# Patient Record
Sex: Female | Born: 1980
Health system: Northeastern US, Academic
[De-identification: ages and names within clinical notes are randomized; demographics above are authoritative.]

## PROBLEM LIST (undated history)

## (undated) DIAGNOSIS — Z789 Other specified health status: Secondary | ICD-10-CM

## (undated) HISTORY — DX: Other specified health status: Z78.9

## (undated) HISTORY — PX: TUBAL LIGATION: SHX77

## (undated) HISTORY — PX: NO PAST SURGERIES: SHX2092

---

## 2016-11-29 ENCOUNTER — Telehealth: Payer: Self-pay

## 2016-11-29 NOTE — Telephone Encounter (Signed)
7.27- pt referred to reproductive genetics from dr.surosky's office for age and prev.child with a chromosome problem. Called pt, when she heard where I was calling from, the phone went dead. Called the pt back and she answered. Doesn't understand why her OB would refer her. Explained it was mainly because of her daughter and her chromosome problem and we would like to get her daughters records. Pt stated she would not do anything and she is perfectly comfortable with anything. Does not want to do testing or any of that. Please cancel her paperwork. Asked pt to call me back if she changes her mind.  [facility] know.

## 2018-03-23 ENCOUNTER — Ambulatory Visit: Payer: Medicaid Other | Admitting: Psychology

## 2018-07-21 DIAGNOSIS — Z3009 Encounter for other general counseling and advice on contraception: Secondary | ICD-10-CM | POA: Diagnosis not present

## 2018-07-21 DIAGNOSIS — N390 Urinary tract infection, site not specified: Secondary | ICD-10-CM | POA: Diagnosis not present

## 2018-07-21 DIAGNOSIS — N760 Acute vaginitis: Secondary | ICD-10-CM | POA: Diagnosis not present

## 2018-07-21 DIAGNOSIS — Z1388 Encounter for screening for disorder due to exposure to contaminants: Secondary | ICD-10-CM | POA: Diagnosis not present

## 2018-07-21 DIAGNOSIS — Z0389 Encounter for observation for other suspected diseases and conditions ruled out: Secondary | ICD-10-CM | POA: Diagnosis not present

## 2018-07-21 DIAGNOSIS — Z113 Encounter for screening for infections with a predominantly sexual mode of transmission: Secondary | ICD-10-CM | POA: Diagnosis not present

## 2018-11-25 ENCOUNTER — Other Ambulatory Visit: Payer: Self-pay

## 2018-11-25 ENCOUNTER — Ambulatory Visit (INDEPENDENT_AMBULATORY_CARE_PROVIDER_SITE_OTHER): Payer: Medicaid Other

## 2018-11-25 DIAGNOSIS — O09529 Supervision of elderly multigravida, unspecified trimester: Secondary | ICD-10-CM

## 2018-11-25 DIAGNOSIS — Z3201 Encounter for pregnancy test, result positive: Secondary | ICD-10-CM | POA: Diagnosis not present

## 2018-11-25 DIAGNOSIS — O09522 Supervision of elderly multigravida, second trimester: Secondary | ICD-10-CM

## 2018-11-25 DIAGNOSIS — O0990 Supervision of high risk pregnancy, unspecified, unspecified trimester: Secondary | ICD-10-CM | POA: Insufficient documentation

## 2018-11-25 LAB — POCT URINE PREGNANCY: Preg Test, Ur: POSITIVE — AB

## 2018-11-25 MED ORDER — BLOOD PRESSURE KIT: PACK | 0 refills | Status: AC

## 2018-11-25 MED ORDER — DOXYLAMINE-PYRIDOXINE 10-10 MG PO TBEC: 2.0000 | DELAYED_RELEASE_TABLET | Freq: Every day | ORAL | 2 refills | Status: DC

## 2018-11-25 NOTE — Progress Notes (Signed)
Felicia Griffin presents today for UPT. She complains of nausea since +UPT. LMP: 08/26/18 aprx.    OBJECTIVE: Appears well, in no apparent distress.  OB History    Gravida  9   Para  8   Term  8   Preterm      AB      Living  9     SAB      TAB      Ectopic      Multiple  1   Live Births  9          Home UPT Result: Positive  In-Office UPT result: Positive  I have reviewed the patient's medical, obstetrical, social, and family histories, and medications.   ASSESSMENT: Positive pregnancy test  PLAN NOB intake completed Prenatal care to be completed at:  [place] sent to the pharmacy BP cuff ordered Pt to download Babyscripts and Mychart apps

## 2018-11-25 NOTE — Progress Notes (Signed)
Patient seen and assessed by nursing staff during this encounter. I have reviewed the chart and agree with the documentation and plan.  Levonia Wolfley, MD 11/25/2018 3:00 PM    

## 2018-12-09 ENCOUNTER — Other Ambulatory Visit (HOSPITAL_COMMUNITY)
Admission: RE | Admit: 2018-12-09 | Discharge: 2018-12-09 | Disposition: A | Discharge status: Home / Self Care | Payer: Medicaid Other | Source: Ambulatory Visit | Attending: Obstetrics & Gynecology | Admitting: Obstetrics & Gynecology

## 2018-12-09 ENCOUNTER — Other Ambulatory Visit: Payer: Self-pay

## 2018-12-09 ENCOUNTER — Ambulatory Visit (INDEPENDENT_AMBULATORY_CARE_PROVIDER_SITE_OTHER): Payer: Medicaid Other | Admitting: Obstetrics & Gynecology

## 2018-12-09 ENCOUNTER — Encounter: Payer: Self-pay | Admitting: Obstetrics & Gynecology

## 2018-12-09 DIAGNOSIS — O0942 Supervision of pregnancy with grand multiparity, second trimester: Secondary | ICD-10-CM

## 2018-12-09 DIAGNOSIS — O09522 Supervision of elderly multigravida, second trimester: Secondary | ICD-10-CM | POA: Diagnosis not present

## 2018-12-09 DIAGNOSIS — O09529 Supervision of elderly multigravida, unspecified trimester: Secondary | ICD-10-CM | POA: Diagnosis not present

## 2018-12-09 DIAGNOSIS — O0990 Supervision of high risk pregnancy, unspecified, unspecified trimester: Secondary | ICD-10-CM

## 2018-12-09 DIAGNOSIS — Z3A15 15 weeks gestation of pregnancy: Secondary | ICD-10-CM | POA: Diagnosis not present

## 2018-12-09 MED ORDER — BLOOD PRESSURE MONITOR KIT: 1.0000 | PACK | 0 refills | Status: DC

## 2018-12-09 NOTE — Patient Instructions (Signed)

## 2018-12-09 NOTE — Progress Notes (Signed)
  Subjective:  She moved from [facility] 11/2017  Felicia Griffin is a [id number] [redacted]w[redacted]d being seen today for her first obstetrical visit.  Her obstetrical history is significant for advanced maternal age and grand multiparity and h/o di/di twins. Patient does intend to breast feed. Pregnancy history fully reviewed.  Patient reports fatigue.  Vitals:   12/09/18 1346  BP: (!) 98/58  Pulse: 87  Weight: 174 lb (78.9 kg)    HISTORY: OB History  Gravida Para Term Preterm AB Living  8 7 7     8   SAB TAB Ectopic Multiple Live Births        1 8    # Outcome Date GA Lbr Len/2nd Weight Sex Delivery Anes PTL Lv  8 Current           7 Term 05/14/17 [redacted]w[redacted]d  6 lb (2.722 kg) F Vag-Spont   LIV  6 Term 04/03/15 [redacted]w[redacted]d  5 lb 8 oz (2.495 kg) M Vag-Spont   LIV  5 Term 01/28/07 [redacted]w[redacted]d  6 lb 1 oz (2.75 kg) F Vag-Spont   LIV  4 Term 04/26/04 [redacted]w[redacted]d  6 lb 2 oz (2.778 kg) F Vag-Spont   LIV  3 Term 08/26/00 [redacted]w[redacted]d  7 lb (3.175 kg) M Vag-Spont   LIV  2A Term 12/25/98 [redacted]w[redacted]d  5 lb (2.268 kg) M Vag-Spont   LIV  2B Term 12/25/98 [redacted]w[redacted]d  4 lb 15 oz (2.24 kg) F Vag-Spont   LIV  1 Term 08/26/97 [redacted]w[redacted]d  7 lb (3.175 kg) M Vag-Spont   LIV   History reviewed. No pertinent past medical history. History reviewed. No pertinent surgical history. Family History  Problem Relation Age of Onset  . Breast cancer Sister      Exam    Uterus:     Pelvic Exam:    Perineum: No Hemorrhoids   Vulva: normal   Vagina:  normal mucosa   pH:     Cervix: no lesions   Adnexa: normal adnexa   Bony Pelvis: average  System: Breast:  normal appearance, no masses or tenderness   Skin: normal coloration and turgor, no rashes    Neurologic: oriented, normal mood   Extremities: deformities: varicose veins both legs   HEENT PERRLA and sclera clear, anicteric   Mouth/Teeth mucous membranes moist, pharynx normal without lesions and dental hygiene good   Neck supple and no masses   Cardiovascular: regular rate and rhythm, no murmurs or  gallops   Respiratory:  appears well, vitals normal, no respiratory distress, acyanotic, normal RR, neck free of mass or lymphadenopathy, chest clear, no wheezing, crepitations, rhonchi, normal symmetric air entry   Abdomen: soft, non-tender; bowel sounds normal; no masses,  no organomegaly   Urinary: urethral meatus normal      Assessment:    Pregnancy: [id number] Patient Active Problem List   Diagnosis Date Noted  . AMA (advanced maternal age) multigravida 35+ 12/09/2018  . Supervision of high risk pregnancy, antepartum 11/25/2018        Plan:     Initial labs drawn. Prenatal vitamins. Problem list reviewed and updated. Genetic Screening discussed . Panorama ordered  Ultrasound discussed; fetal survey: ordered.  Follow up in 4 weeks. 50% of 30 min visit spent on counseling and coordination of care.     Emeterio Reeve 12/09/2018

## 2018-12-09 NOTE — Progress Notes (Signed)
NOB  Genetic Screening:Desires  Last pap:09/2018 [facility] pt [facility]    CC:  UTI sx's per pt.

## 2018-12-10 LAB — OBSTETRIC PANEL, INCLUDING HIV
Antibody Screen: NEGATIVE
Basophils Absolute: 0 10*3/uL (ref 0.0–0.2)
Basos: 0 %
EOS (ABSOLUTE): 0 10*3/uL (ref 0.0–0.4)
Eos: 0 %
HIV Screen 4th Generation wRfx: NONREACTIVE
Hematocrit: 28.4 % — ABNORMAL LOW (ref 34.0–46.6)
Hemoglobin: 9.6 g/dL — ABNORMAL LOW (ref 11.1–15.9)
Hepatitis B Surface Ag: NEGATIVE
Immature Grans (Abs): 0.1 10*3/uL (ref 0.0–0.1)
Immature Granulocytes: 1 %
Lymphocytes Absolute: 1.6 10*3/uL (ref 0.7–3.1)
Lymphs: 12 %
MCH: 31.5 pg (ref 26.6–33.0)
MCHC: 33.8 g/dL (ref 31.5–35.7)
MCV: 93 fL (ref 79–97)
Monocytes Absolute: 0.7 10*3/uL (ref 0.1–0.9)
Monocytes: 5 %
Neutrophils Absolute: 11.1 10*3/uL — ABNORMAL HIGH (ref 1.4–7.0)
Neutrophils: 82 %
Platelets: 247 10*3/uL (ref 150–450)
RBC: 3.05 x10E6/uL — ABNORMAL LOW (ref 3.77–5.28)
RDW: 14.1 % (ref 11.7–15.4)
RPR Ser Ql: NONREACTIVE
Rh Factor: POSITIVE
Rubella Antibodies, IGG: 18.7 index (ref >0.99)
WBC: 13.5 10*3/uL — ABNORMAL HIGH (ref 3.4–10.8)

## 2018-12-11 LAB — URINE CULTURE, OB REFLEX

## 2018-12-11 LAB — CULTURE, OB URINE

## 2018-12-14 ENCOUNTER — Encounter: Payer: Self-pay | Admitting: Obstetrics & Gynecology

## 2018-12-14 LAB — CERVICOVAGINAL ANCILLARY ONLY
Candida vaginitis: NEGATIVE
Chlamydia: NEGATIVE
Neisseria Gonorrhea: NEGATIVE
Trichomonas: NEGATIVE

## 2018-12-15 LAB — CYTOLOGY - PAP
Diagnosis: NEGATIVE
HPV 16/18/45 genotyping: NEGATIVE
HPV: DETECTED — AB

## 2018-12-21 ENCOUNTER — Ambulatory Visit: Payer: Medicaid Other | Admitting: Registered Nurse

## 2018-12-23 ENCOUNTER — Encounter: Payer: Self-pay | Admitting: Obstetrics and Gynecology

## 2018-12-23 DIAGNOSIS — R8789 Other abnormal findings in specimens from female genital organs: Secondary | ICD-10-CM | POA: Insufficient documentation

## 2018-12-23 DIAGNOSIS — Z641 Problems related to multiparity: Secondary | ICD-10-CM | POA: Insufficient documentation

## 2018-12-23 DIAGNOSIS — R87618 Other abnormal cytological findings on specimens from cervix uteri: Secondary | ICD-10-CM | POA: Insufficient documentation

## 2019-01-04 ENCOUNTER — Ambulatory Visit: Payer: Medicaid Other | Admitting: Registered Nurse

## 2019-01-06 ENCOUNTER — Encounter (HOSPITAL_COMMUNITY): Payer: Self-pay

## 2019-01-06 ENCOUNTER — Ambulatory Visit (HOSPITAL_COMMUNITY)
Admission: RE | Admit: 2019-01-06 | Discharge: 2019-01-06 | Disposition: A | Discharge status: Home / Self Care | Payer: Medicaid Other | Source: Ambulatory Visit | Attending: Obstetrics and Gynecology | Admitting: Obstetrics and Gynecology

## 2019-01-06 ENCOUNTER — Ambulatory Visit (HOSPITAL_COMMUNITY): Payer: Medicaid Other | Admitting: *Deleted

## 2019-01-06 ENCOUNTER — Other Ambulatory Visit: Payer: Self-pay

## 2019-01-06 ENCOUNTER — Encounter: Payer: Medicaid Other | Admitting: Obstetrics and Gynecology

## 2019-01-06 VITALS — BP 99/69 | HR 84 | Temp 98.3°F | Ht 69.0 in

## 2019-01-06 DIAGNOSIS — O0990 Supervision of high risk pregnancy, unspecified, unspecified trimester: Secondary | ICD-10-CM

## 2019-01-06 DIAGNOSIS — O09529 Supervision of elderly multigravida, unspecified trimester: Secondary | ICD-10-CM | POA: Diagnosis not present

## 2019-01-06 DIAGNOSIS — Z3A19 19 weeks gestation of pregnancy: Secondary | ICD-10-CM | POA: Diagnosis not present

## 2019-01-06 DIAGNOSIS — O09522 Supervision of elderly multigravida, second trimester: Secondary | ICD-10-CM

## 2019-01-06 DIAGNOSIS — O0940 Supervision of pregnancy with grand multiparity, unspecified trimester: Secondary | ICD-10-CM | POA: Diagnosis not present

## 2019-01-07 ENCOUNTER — Other Ambulatory Visit (HOSPITAL_COMMUNITY): Payer: Self-pay | Admitting: *Deleted

## 2019-01-07 DIAGNOSIS — O09523 Supervision of elderly multigravida, third trimester: Secondary | ICD-10-CM

## 2019-01-15 DIAGNOSIS — O0990 Supervision of high risk pregnancy, unspecified, unspecified trimester: Secondary | ICD-10-CM | POA: Diagnosis not present

## 2019-01-21 ENCOUNTER — Encounter: Payer: Medicaid Other | Admitting: Obstetrics and Gynecology

## 2019-02-02 ENCOUNTER — Encounter: Payer: Medicaid Other | Admitting: Obstetrics and Gynecology

## 2019-02-15 ENCOUNTER — Encounter: Payer: Medicaid Other | Admitting: Obstetrics and Gynecology

## 2019-03-01 ENCOUNTER — Encounter: Payer: Medicaid Other | Admitting: Obstetrics & Gynecology

## 2019-03-03 ENCOUNTER — Other Ambulatory Visit: Payer: Self-pay

## 2019-03-03 ENCOUNTER — Ambulatory Visit (INDEPENDENT_AMBULATORY_CARE_PROVIDER_SITE_OTHER): Payer: Medicaid Other | Admitting: Obstetrics & Gynecology

## 2019-03-03 DIAGNOSIS — O0992 Supervision of high risk pregnancy, unspecified, second trimester: Secondary | ICD-10-CM

## 2019-03-03 DIAGNOSIS — O0990 Supervision of high risk pregnancy, unspecified, unspecified trimester: Secondary | ICD-10-CM

## 2019-03-03 DIAGNOSIS — Z3A27 27 weeks gestation of pregnancy: Secondary | ICD-10-CM

## 2019-03-03 MED ORDER — PANTOPRAZOLE SODIUM 20 MG PO TBEC: 20.0000 mg | DELAYED_RELEASE_TABLET | Freq: Every day | ORAL | 3 refills | Status: DC

## 2019-03-03 NOTE — Patient Instructions (Signed)

## 2019-03-03 NOTE — Progress Notes (Signed)
ROB Declined TDAP 

## 2019-03-03 NOTE — Progress Notes (Signed)
   PRENATAL VISIT NOTE  Subjective:  Felicia Griffin is a 38 y.o. [id number] at [redacted]w[redacted]d being seen today for ongoing prenatal care.  She is currently monitored for the following issues for this high-risk pregnancy and has Supervision of high risk pregnancy, antepartum; AMA (advanced maternal age) multigravida 35+; [place] multiparity; and Abnormal Papanicolaou smear of cervix with positive human papilloma virus (HPV) test on their problem list.  Patient reports heartburn, nausea and varicose veins.  Contractions: Not present. Vag. Bleeding: None.  Movement: Present. Denies leaking of fluid.   The following portions of the patient's history were reviewed and updated as appropriate: allergies, current medications, past family history, past medical history, past social history, past surgical history and problem list.   Objective:   Vitals:   03/03/19 1011  BP: 101/63  Pulse: 83  Weight: 175 lb 14.4 oz (79.8 kg)    Fetal Status: Fetal Heart Rate (bpm): 143   Movement: Present     General:  Alert, oriented and cooperative. Patient is in no acute distress.  Skin: Skin is warm and dry. No rash noted.   Cardiovascular: Normal heart rate noted  Respiratory: Normal respiratory effort, no problems with respiration noted  Abdomen: Soft, gravid, appropriate for gestational age.  Pain/Pressure: Present     Pelvic: Cervical exam deferred        Extremities: Normal range of motion.  Edema: Trace  Mental Status: Normal mood and affect. Normal behavior. Normal judgment and thought content.   Assessment and Plan:  Pregnancy: [id number] at [redacted]w[redacted]d 1. Supervision of high risk pregnancy, antepartum Needs GTT next. Add Protonix for reflux  Preterm labor symptoms and general obstetric precautions including but not limited to vaginal bleeding, contractions, leaking of fluid and fetal movement were reviewed in detail with the patient. Please refer to After Visit Summary for other counseling recommendations.   Return  in about 2 weeks (around 03/17/2019) for 2 hr gtt.  Future Appointments  Date Time Provider [place]  03/17/2019 11:15 AM [place] [facility]  03/17/2019 11:15 AM [facility] [place] 4 [facility]    Emeterio Reeve, MD

## 2019-03-10 ENCOUNTER — Encounter: Payer: Medicaid Other | Admitting: Obstetrics and Gynecology

## 2019-03-17 ENCOUNTER — Ambulatory Visit (HOSPITAL_COMMUNITY)
Admission: RE | Admit: 2019-03-17 | Discharge: 2019-03-17 | Disposition: A | Discharge status: Home / Self Care | Payer: Medicaid Other | Source: Ambulatory Visit | Attending: Maternal & Fetal Medicine | Admitting: Maternal & Fetal Medicine

## 2019-03-17 ENCOUNTER — Ambulatory Visit (HOSPITAL_COMMUNITY): Payer: Medicaid Other

## 2019-03-18 ENCOUNTER — Encounter: Payer: Medicaid Other | Admitting: Obstetrics and Gynecology

## 2019-03-18 ENCOUNTER — Other Ambulatory Visit: Payer: Medicaid Other

## 2019-03-19 ENCOUNTER — Ambulatory Visit (HOSPITAL_COMMUNITY): Payer: Medicaid Other

## 2019-03-22 ENCOUNTER — Ambulatory Visit (HOSPITAL_COMMUNITY): Payer: Medicaid Other

## 2019-03-29 ENCOUNTER — Ambulatory Visit (HOSPITAL_COMMUNITY): Payer: Medicaid Other

## 2019-03-31 ENCOUNTER — Ambulatory Visit (HOSPITAL_COMMUNITY): Payer: Medicaid Other | Admitting: *Deleted

## 2019-03-31 ENCOUNTER — Other Ambulatory Visit: Payer: Self-pay

## 2019-03-31 ENCOUNTER — Other Ambulatory Visit (HOSPITAL_COMMUNITY): Payer: Self-pay | Admitting: *Deleted

## 2019-03-31 ENCOUNTER — Ambulatory Visit (HOSPITAL_COMMUNITY)
Admission: RE | Admit: 2019-03-31 | Discharge: 2019-03-31 | Disposition: A | Discharge status: Home / Self Care | Payer: Medicaid Other | Source: Ambulatory Visit | Attending: Maternal & Fetal Medicine | Admitting: Maternal & Fetal Medicine

## 2019-03-31 ENCOUNTER — Encounter (HOSPITAL_COMMUNITY): Payer: Self-pay

## 2019-03-31 VITALS — BP 112/61 | HR 84 | Temp 97.6°F

## 2019-03-31 DIAGNOSIS — Z3A31 31 weeks gestation of pregnancy: Secondary | ICD-10-CM

## 2019-03-31 DIAGNOSIS — O0943 Supervision of pregnancy with grand multiparity, third trimester: Secondary | ICD-10-CM

## 2019-03-31 DIAGNOSIS — O09529 Supervision of elderly multigravida, unspecified trimester: Secondary | ICD-10-CM

## 2019-03-31 DIAGNOSIS — O09523 Supervision of elderly multigravida, third trimester: Secondary | ICD-10-CM | POA: Insufficient documentation

## 2019-03-31 DIAGNOSIS — Z362 Encounter for other antenatal screening follow-up: Secondary | ICD-10-CM

## 2019-04-08 ENCOUNTER — Other Ambulatory Visit: Payer: Self-pay

## 2019-04-08 ENCOUNTER — Encounter: Payer: Self-pay | Admitting: Obstetrics and Gynecology

## 2019-04-08 ENCOUNTER — Ambulatory Visit (INDEPENDENT_AMBULATORY_CARE_PROVIDER_SITE_OTHER): Payer: Medicaid Other | Admitting: Obstetrics and Gynecology

## 2019-04-08 VITALS — BP 117/78 | HR 103 | Wt 172.4 lb

## 2019-04-08 DIAGNOSIS — O0990 Supervision of high risk pregnancy, unspecified, unspecified trimester: Secondary | ICD-10-CM

## 2019-04-08 DIAGNOSIS — O0993 Supervision of high risk pregnancy, unspecified, third trimester: Secondary | ICD-10-CM

## 2019-04-08 DIAGNOSIS — O09523 Supervision of elderly multigravida, third trimester: Secondary | ICD-10-CM

## 2019-04-08 DIAGNOSIS — Z3A32 32 weeks gestation of pregnancy: Secondary | ICD-10-CM

## 2019-04-08 DIAGNOSIS — Z641 Problems related to multiparity: Secondary | ICD-10-CM
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
## 2019-04-14 ENCOUNTER — Other Ambulatory Visit: Payer: Self-pay

## 2019-04-14 ENCOUNTER — Other Ambulatory Visit: Payer: Medicaid Other

## 2019-04-14 ENCOUNTER — Ambulatory Visit (INDEPENDENT_AMBULATORY_CARE_PROVIDER_SITE_OTHER): Payer: Medicaid Other | Admitting: Obstetrics and Gynecology

## 2019-04-14 VITALS — BP 110/67 | HR 90 | Wt 172.8 lb

## 2019-04-14 DIAGNOSIS — O0990 Supervision of high risk pregnancy, unspecified, unspecified trimester: Secondary | ICD-10-CM

## 2019-04-14 DIAGNOSIS — Z3A33 33 weeks gestation of pregnancy: Secondary | ICD-10-CM | POA: Diagnosis not present

## 2019-04-14 DIAGNOSIS — O0993 Supervision of high risk pregnancy, unspecified, third trimester: Secondary | ICD-10-CM

## 2019-04-14 DIAGNOSIS — Z3009 Encounter for other general counseling and advice on contraception: Secondary | ICD-10-CM | POA: Insufficient documentation

## 2019-04-14 DIAGNOSIS — O09523 Supervision of elderly multigravida, third trimester: Secondary | ICD-10-CM

## 2019-04-14 DIAGNOSIS — Z641 Problems related to multiparity: Secondary | ICD-10-CM

## 2019-04-14 LAB — POCT URINALYSIS DIPSTICK
Bilirubin, UA: NEGATIVE
Blood, UA: NEGATIVE
Glucose, UA: NEGATIVE
Nitrite, UA: NEGATIVE
Protein, UA: NEGATIVE
Spec Grav, UA: 1.015 (ref 1.010–1.025)
Urobilinogen, UA: 0.2 E.U./dL
pH, UA: 8 (ref 5.0–8.0)

## 2019-04-14 NOTE — Progress Notes (Addendum)
HOB/GTT.  Declined FLU and TDAP.  Reports no problems today. BTS signed.

## 2019-04-14 NOTE — Progress Notes (Signed)
Subjective:  Felicia Griffin is a 38 y.o. [id number] at [redacted]w[redacted]d being seen today for ongoing prenatal care.  She is currently monitored for the following issues for this high-risk pregnancy and has Supervision of high risk pregnancy, antepartum; AMA (advanced maternal age) multigravida 52+; [place] multiparity; Abnormal Papanicolaou smear of cervix with positive human papilloma virus (HPV) test; and Unwanted fertility on their problem list.  Patient reports general discomforts of pregnancy.  Contractions: Not present. Vag. Bleeding: None.  Movement: Present. Denies leaking of fluid.   The following portions of the patient's history were reviewed and updated as appropriate: allergies, current medications, past family history, past medical history, past social history, past surgical history and problem list. Problem list updated.  Objective:   Vitals:   04/14/19 0847  BP: 110/67  Pulse: 90  Weight: 172 lb 12.8 oz (78.4 kg)    Fetal Status:     Movement: Present     General:  Alert, oriented and cooperative. Patient is in no acute distress.  Skin: Skin is warm and dry. No rash noted.   Cardiovascular: Normal heart rate noted  Respiratory: Normal respiratory effort, no problems with respiration noted  Abdomen: Soft, gravid, appropriate for gestational age. Pain/Pressure: Present     Pelvic:  Cervical exam deferred        Extremities: Normal range of motion.  Edema: None  Mental Status: Normal mood and affect. Normal behavior. Normal judgment and thought content.   Urinalysis:      Assessment and Plan:  Pregnancy: [id number] at [redacted]w[redacted]d  1. Supervision of high risk pregnancy, antepartum Stable Growth scan 05/05/19 - Glucose Tolerance, 2 Hours w/1 Hour - CBC - HIV antibody (with reflex) - RPR - POCT Urinalysis Dipstick  2. [place] multiparity Stable BTL papers today  3. Multigravida of advanced maternal age in third trimester LR NIPS Growth scan 05/05/19  4. Unwanted fertility BTL papers  today  Preterm labor symptoms and general obstetric precautions including but not limited to vaginal bleeding, contractions, leaking of fluid and fetal movement were reviewed in detail with the patient. Please refer to After Visit Summary for other counseling recommendations.  Return in about 2 weeks (around 04/28/2019) for OB visit, virtual.   Chancy Milroy, MD

## 2019-04-14 NOTE — Patient Instructions (Signed)
Third Trimester of Pregnancy The third trimester is from week 28 through week 40 (months 7 through 9). The third trimester is a time when the unborn baby (fetus) is growing rapidly. At the end of the ninth month, the fetus is about 20 inches in length and weighs 6-10 pounds. Body changes during your third trimester Your body will continue to go through many changes during pregnancy. The changes vary from woman to woman. During the third trimester:  Your weight will continue to increase. You can expect to gain 25-35 pounds (11-16 kg) by the end of the pregnancy.  You may begin to get stretch marks on your hips, abdomen, and breasts.  You may urinate more often because the fetus is moving lower into your pelvis and pressing on your bladder.  You may develop or continue to have heartburn. This is caused by increased hormones that slow down muscles in the digestive tract.  You may develop or continue to have constipation because increased hormones slow digestion and cause the muscles that push waste through your intestines to relax.  You may develop hemorrhoids. These are swollen veins (varicose veins) in the rectum that can itch or be painful.  You may develop swollen, bulging veins (varicose veins) in your legs.  You may have increased body aches in the pelvis, back, or thighs. This is due to weight gain and increased hormones that are relaxing your joints.  You may have changes in your hair. These can include thickening of your hair, rapid growth, and changes in texture. Some women also have hair loss during or after pregnancy, or hair that feels dry or thin. Your hair will most likely return to normal after your baby is born.  Your breasts will continue to grow and they will continue to become tender. A yellow fluid (colostrum) may leak from your breasts. This is the first milk you are producing for your baby.  Your belly button may stick out.  You may notice more swelling in your hands,  face, or ankles.  You may have increased tingling or numbness in your hands, arms, and legs. The skin on your belly may also feel numb.  You may feel short of breath because of your expanding uterus.  You may have more problems sleeping. This can be caused by the size of your belly, increased need to urinate, and an increase in your body's metabolism.  You may notice the fetus "dropping," or moving lower in your abdomen (lightening).  You may have increased vaginal discharge.  You may notice your joints feel loose and you may have pain around your pelvic bone. What to expect at prenatal visits You will have prenatal exams every 2 weeks until week 36. Then you will have weekly prenatal exams. During a routine prenatal visit:  You will be weighed to make sure you and the baby are growing normally.  Your blood pressure will be taken.  Your abdomen will be measured to track your baby's growth.  The fetal heartbeat will be listened to.  Any test results from the previous visit will be discussed.  You may have a cervical check near your due date to see if your cervix has softened or thinned (effaced).  You will be tested for Group B streptococcus. This happens between 35 and 37 weeks. Your health care provider may ask you:  What your birth plan is.  How you are feeling.  If you are feeling the baby move.  If you have had any abnormal   symptoms, such as leaking fluid, bleeding, severe headaches, or abdominal cramping.  If you are using any tobacco products, including cigarettes, chewing tobacco, and electronic cigarettes.  If you have any questions. Other tests or screenings that may be performed during your third trimester include:  Blood tests that check for low iron levels (anemia).  Fetal testing to check the health, activity level, and growth of the fetus. Testing is done if you have certain medical conditions or if there are problems during the pregnancy.  Nonstress test  (NST). This test checks the health of your baby to make sure there are no signs of problems, such as the baby not getting enough oxygen. During this test, a belt is placed around your belly. The baby is made to move, and its heart rate is monitored during movement. What is false labor? False labor is a condition in which you feel small, irregular tightenings of the muscles in the womb (contractions) that usually go away with rest, changing position, or drinking water. These are called Braxton Hicks contractions. Contractions may last for hours, days, or even weeks before true labor sets in. If contractions come at regular intervals, become more frequent, increase in intensity, or become painful, you should see your health care provider. What are the signs of labor?  Abdominal cramps.  Regular contractions that start at 10 minutes apart and become stronger and more frequent with time.  Contractions that start on the top of the uterus and spread down to the lower abdomen and back.  Increased pelvic pressure and dull back pain.  A watery or bloody mucus discharge that comes from the vagina.  Leaking of amniotic fluid. This is also known as your "water breaking." It could be a slow trickle or a gush. Let your health care provider know if it has a color or strange odor. If you have any of these signs, call your health care provider right away, even if it is before your due date. Follow these instructions at home: Medicines  Follow your health care provider's instructions regarding medicine use. Specific medicines may be either safe or unsafe to take during pregnancy.  Take a prenatal vitamin that contains at least 600 micrograms (mcg) of folic acid.  If you develop constipation, try taking a stool softener if your health care provider approves. Eating and drinking   Eat a balanced diet that includes fresh fruits and vegetables, whole grains, good sources of protein such as meat, eggs, or tofu,  and low-fat dairy. Your health care provider will help you determine the amount of weight gain that is right for you.  Avoid raw meat and uncooked cheese. These carry germs that can cause birth defects in the baby.  If you have low calcium intake from food, talk to your health care provider about whether you should take a daily calcium supplement.  Eat four or five small meals rather than three large meals a day.  Limit foods that are high in fat and processed sugars, such as fried and sweet foods.  To prevent constipation: ? Drink enough fluid to keep your urine clear or pale yellow. ? Eat foods that are high in fiber, such as fresh fruits and vegetables, whole grains, and beans. Activity  Exercise only as directed by your health care provider. Most women can continue their usual exercise routine during pregnancy. Try to exercise for 30 minutes at least 5 days a week. Stop exercising if you experience uterine contractions.  Avoid heavy lifting.  Do   not exercise in extreme heat or humidity, or at high altitudes.  Wear low-heel, comfortable shoes.  Practice good posture.  You may continue to have sex unless your health care provider tells you otherwise. Relieving pain and discomfort  Take frequent breaks and rest with your legs elevated if you have leg cramps or low back pain.  Take warm sitz baths to soothe any pain or discomfort caused by hemorrhoids. Use hemorrhoid cream if your health care provider approves.  Wear a good support bra to prevent discomfort from breast tenderness.  If you develop varicose veins: ? Wear support pantyhose or compression stockings as told by your healthcare provider. ? Elevate your feet for 15 minutes, 3-4 times a day. Prenatal care  Write down your questions. Take them to your prenatal visits.  Keep all your prenatal visits as told by your health care provider. This is important. Safety  Wear your seat belt at all times when driving.  Make  a list of emergency phone numbers, including numbers for family, friends, the hospital, and police and fire departments. General instructions  Avoid cat litter boxes and soil used by cats. These carry germs that can cause birth defects in the baby. If you have a cat, ask someone to clean the litter box for you.  Do not travel far distances unless it is absolutely necessary and only with the approval of your health care provider.  Do not use hot tubs, steam rooms, or saunas.  Do not drink alcohol.  Do not use any products that contain nicotine or tobacco, such as cigarettes and e-cigarettes. If you need help quitting, ask your health care provider.  Do not use any medicinal herbs or unprescribed drugs. These chemicals affect the formation and growth of the baby.  Do not douche or use tampons or scented sanitary pads.  Do not cross your legs for long periods of time.  To prepare for the arrival of your baby: ? Take prenatal classes to understand, practice, and ask questions about labor and delivery. ? Make a trial run to the hospital. ? Visit the hospital and tour the maternity area. ? Arrange for maternity or paternity leave through employers. ? Arrange for family and friends to take care of pets while you are in the hospital. ? Purchase a rear-facing car seat and make sure you know how to install it in your car. ? Pack your hospital bag. ? Prepare the baby's nursery. Make sure to remove all pillows and stuffed animals from the baby's crib to prevent suffocation.  Visit your dentist if you have not gone during your pregnancy. Use a soft toothbrush to brush your teeth and be gentle when you floss. Contact a health care provider if:  You are unsure if you are in labor or if your water has broken.  You become dizzy.  You have mild pelvic cramps, pelvic pressure, or nagging pain in your abdominal area.  You have lower back pain.  You have persistent nausea, vomiting, or diarrhea.   You have an unusual or bad smelling vaginal discharge.  You have pain when you urinate. Get help right away if:  Your water breaks before 37 weeks.  You have regular contractions less than 5 minutes apart before 37 weeks.  You have a fever.  You are leaking fluid from your vagina.  You have spotting or bleeding from your vagina.  You have severe abdominal pain or cramping.  You have rapid weight loss or weight gain.  You have   shortness of breath with chest pain.  You notice sudden or extreme swelling of your face, hands, ankles, feet, or legs.  Your baby makes fewer than 10 movements in 2 hours.  You have severe headaches that do not go away when you take medicine.  You have vision changes. Summary  The third trimester is from week 28 through week 40, months 7 through 9. The third trimester is a time when the unborn baby (fetus) is growing rapidly.  During the third trimester, your discomfort may increase as you and your baby continue to gain weight. You may have abdominal, leg, and back pain, sleeping problems, and an increased need to urinate.  During the third trimester your breasts will keep growing and they will continue to become tender. A yellow fluid (colostrum) may leak from your breasts. This is the first milk you are producing for your baby.  False labor is a condition in which you feel small, irregular tightenings of the muscles in the womb (contractions) that eventually go away. These are called Braxton Hicks contractions. Contractions may last for hours, days, or even weeks before true labor sets in.  Signs of labor can include: abdominal cramps; regular contractions that start at 10 minutes apart and become stronger and more frequent with time; watery or bloody mucus discharge that comes from the vagina; increased pelvic pressure and dull back pain; and leaking of amniotic fluid. This information is not intended to replace advice given to you by your health  care provider. Make sure you discuss any questions you have with your health care provider. Document Released: 04/16/2001 Document Revised: 08/13/2018 Document Reviewed: 05/28/2016 Elsevier Patient Education  2020 [employer]  

## 2019-04-15 LAB — CBC
Hematocrit: 28.5 % — ABNORMAL LOW (ref 34.0–46.6)
Hemoglobin: 9.7 g/dL — ABNORMAL LOW (ref 11.1–15.9)
MCH: 30.2 pg (ref 26.6–33.0)
MCHC: 34 g/dL (ref 31.5–35.7)
MCV: 89 fL (ref 79–97)
Platelets: 238 10*3/uL (ref 150–450)
RBC: 3.21 x10E6/uL — ABNORMAL LOW (ref 3.77–5.28)
RDW: 12.9 % (ref 11.7–15.4)
WBC: 10.4 10*3/uL (ref 3.4–10.8)

## 2019-04-15 LAB — GLUCOSE TOLERANCE, 2 HOURS W/ 1HR
Glucose, 1 hour: 120 mg/dL (ref 65–179)
Glucose, 2 hour: 91 mg/dL (ref 65–152)
Glucose, Fasting: 76 mg/dL (ref 65–91)

## 2019-04-15 LAB — HIV ANTIBODY (ROUTINE TESTING W REFLEX): HIV Screen 4th Generation wRfx: NONREACTIVE

## 2019-04-15 LAB — RPR: RPR Ser Ql: NONREACTIVE

## 2019-04-20 ENCOUNTER — Other Ambulatory Visit: Payer: Self-pay | Admitting: *Deleted

## 2019-04-20 DIAGNOSIS — O0990 Supervision of high risk pregnancy, unspecified, unspecified trimester: Secondary | ICD-10-CM

## 2019-04-20 MED ORDER — VITAFOL ULTRA 29-0.6-0.4-200 MG PO CAPS: 1.0000 | ORAL_CAPSULE | Freq: Every day | ORAL | 11 refills | Status: DC

## 2019-04-20 NOTE — Progress Notes (Signed)
Pt made aware of lab results today. Pt request Rx for PNV- Vitafol Ultra sent. Pt advised she may want to take an Iron supplement in addition to PNV.

## 2019-04-28 ENCOUNTER — Encounter: Payer: Medicaid Other | Admitting: Obstetrics and Gynecology

## 2019-05-04 ENCOUNTER — Telehealth (INDEPENDENT_AMBULATORY_CARE_PROVIDER_SITE_OTHER): Payer: Medicaid Other | Admitting: Family Medicine

## 2019-05-04 ENCOUNTER — Encounter: Payer: Self-pay | Admitting: Family Medicine

## 2019-05-04 ENCOUNTER — Other Ambulatory Visit: Payer: Self-pay

## 2019-05-04 DIAGNOSIS — Z641 Problems related to multiparity: Secondary | ICD-10-CM

## 2019-05-04 DIAGNOSIS — Z3A35 35 weeks gestation of pregnancy: Secondary | ICD-10-CM | POA: Diagnosis not present

## 2019-05-04 DIAGNOSIS — O0993 Supervision of high risk pregnancy, unspecified, third trimester: Secondary | ICD-10-CM | POA: Diagnosis not present

## 2019-05-04 DIAGNOSIS — O09523 Supervision of elderly multigravida, third trimester: Secondary | ICD-10-CM | POA: Diagnosis not present

## 2019-05-04 DIAGNOSIS — O09529 Supervision of elderly multigravida, unspecified trimester: Secondary | ICD-10-CM

## 2019-05-04 DIAGNOSIS — K0889 Other specified disorders of teeth and supporting structures: Secondary | ICD-10-CM

## 2019-05-04 DIAGNOSIS — O0990 Supervision of high risk pregnancy, unspecified, unspecified trimester: Secondary | ICD-10-CM

## 2019-05-04 MED ORDER — METRONIDAZOLE 500 MG PO TABS: 500.0000 mg | ORAL_TABLET | Freq: Two times a day (BID) | ORAL | 0 refills | Status: DC

## 2019-05-04 NOTE — Progress Notes (Signed)
Virtual ROB  CC: Vaginal Discharge and odor x 2 wks now.

## 2019-05-04 NOTE — Progress Notes (Signed)
   TELEHEALTH VIRTUAL OBSTETRICS VISIT ENCOUNTER NOTE  I connected with Felicia Griffin on 05/04/19 at  3:15 PM EST by telephone at home and verified that I am speaking with the correct Griffin using two identifiers.   I discussed the limitations, risks, security and privacy concerns of performing an evaluation and management service by telephone and the availability of in Griffin appointments. I also discussed with the patient that there may be a patient responsible charge related to this service. The patient expressed understanding and agreed to proceed.  Subjective:  Felicia Griffin is a 38 y.o. [id number] at [redacted]w[redacted]d being followed for ongoing prenatal care.  She is currently monitored for the following issues for this high-risk pregnancy and has Supervision of high risk pregnancy, antepartum; AMA (advanced maternal age) multigravida 38+; [place] multiparity; Abnormal Papanicolaou smear of cervix with positive human papilloma virus (HPV) test; and Unwanted fertility on their problem list.  Patient reports leg pain due to varicose veins, feeling baby drop. Reports fetal movement. Denies any contractions, bleeding or leaking of fluid.   The following portions of the patient's history were reviewed and updated as appropriate: allergies, current medications, past family history, past medical history, past social history, past surgical history and problem list.   Objective:   General:  Alert, oriented and cooperative.   Mental Status: Normal mood and affect perceived. Normal judgment and thought content.  Rest of physical exam deferred due to type of encounter  Assessment and Plan:  Pregnancy: [id number] at [redacted]w[redacted]d 1. Supervision of high risk pregnancy, antepartum Good fetal movement  2. [place] multiparity  3. Antepartum multigravida of advanced maternal age Delivery by 71 weeks.  Preterm labor symptoms and general obstetric precautions including but not limited to vaginal bleeding, contractions,  leaking of fluid and fetal movement were reviewed in detail with the patient.  I discussed the assessment and treatment plan with the patient. The patient was provided an opportunity to ask questions and all were answered. The patient agreed with the plan and demonstrated an understanding of the instructions. The patient was advised to call back or seek an in-[facility] office evaluation/go to [facility] at [facility] for any urgent or concerning symptoms. Please refer to After Visit Summary for other counseling recommendations.   I provided 8 minutes of non-face-to-face time during this encounter.  No follow-ups on file.  Future Appointments  Date Time Provider [place]  05/04/2019  3:15 PM Truett Mainland, DO [facility] None  05/05/2019 11:15 AM [facility] NURSE [place] [facility]  05/05/2019 11:15 AM [place] for [employer], [place]

## 2019-05-05 ENCOUNTER — Ambulatory Visit (HOSPITAL_COMMUNITY): Admission: RE | Admit: 2019-05-05 | Payer: Medicaid Other | Source: Ambulatory Visit

## 2019-05-05 ENCOUNTER — Encounter (HOSPITAL_COMMUNITY): Payer: Self-pay

## 2019-05-05 ENCOUNTER — Ambulatory Visit (HOSPITAL_COMMUNITY): Payer: Medicaid Other

## 2019-05-06 ENCOUNTER — Encounter: Payer: Self-pay | Admitting: *Deleted

## 2019-05-07 NOTE — L&D Delivery Note (Signed)
OB/GYN Faculty Practice Delivery Note  Felicia Griffin is a 39 y.o. [id number] s/p VAVD at [redacted]w[redacted]d. She was admitted for AMA IOL.   ROM: 0h 26m with clear fluid GBS Status:  Negative/-- (01/22 0000) Maximum Maternal Temperature: 99.1 F   Labor Progress: . Patient arrived at 1 cm dilation and was induced with misoprostol x3, at which point her cervix was 3.5cm and she was started on pitocin. She progressed rapidly to fully dilated and then had AROM for clear. There was subsequently a prolonged deceleration into the 60's with intermittent recovery and patient was consented for a VAVD.   Delivery Date/Time: 06/03/2019 at 0208  Operative Delivery Note Infant was delivered via Vacuum Assisted Vaginal Delivery due to NRFHT.  The patient was examined and found to be Presentation: vertex; Position: Occiput,, Posterior; Station: +3.  Verbal consent: obtained from patient.  Risks and benefits discussed in detail.  Risks include, but are not limited to the risks of anesthesia, bleeding, infection, damage to maternal tissues, fetal cephalhematoma.  There is also the risk of inability to effect vaginal delivery of the head, or shoulder dystocia that cannot be resolved by established maneuvers, leading to the need for emergency cesarean section.  The Kiwi vacuum was positioned over the sagittal suture.  Pressure was then increased to 500 mmHg, and the patient was instructed to push.  Pulling was administered along the pelvic curve.  3 pulls were administered during 1 contraction, with release of pressure between contractions.  No popoffs.  The infant was then delivered atraumatically.   Tight nuchal cord x1. Shoulder and body delivered in usual fashion. Infant with spontaneous cry, placed on mother's abdomen, dried and stimulated. Cord clamped x 2 after 1-minute delay, and cut by patient. Cord blood drawn. Placenta delivered spontaneously with gentle cord traction. Fundus firm with massage and Pitocin. Labia,  perineum, vagina, and cervix inspected with hemostatic Left vaginal laceration that was not repaired.   Given grand multiparity she was given one dose of IM methergine and buccal misoprostol.   Sponge, instrument and needle counts were correct x2.  Placenta: 3v intact, to L&D Complications: prolonged bradycardia leading to VAVD Lacerations: L vaginal, hemostatic and not repaired EBL: 732 cc Analgesia: epidural   Infant: APGAR (1 MIN):  9 APGAR (5 MINS): 9   Weight: 3515 grams  Zack Seal, MD/[facility] OB/GYN Fellow, Faculty Practice

## 2019-05-20 ENCOUNTER — Ambulatory Visit (HOSPITAL_COMMUNITY)
Admission: RE | Admit: 2019-05-20 | Discharge: 2019-05-20 | Disposition: A | Discharge status: Home / Self Care | Payer: Medicaid Other | Source: Ambulatory Visit | Attending: Maternal & Fetal Medicine | Admitting: Maternal & Fetal Medicine

## 2019-05-20 ENCOUNTER — Encounter: Payer: Medicaid Other | Admitting: Family Medicine

## 2019-05-20 ENCOUNTER — Other Ambulatory Visit (HOSPITAL_COMMUNITY): Payer: Self-pay | Admitting: *Deleted

## 2019-05-20 ENCOUNTER — Ambulatory Visit (HOSPITAL_COMMUNITY): Payer: Medicaid Other | Admitting: *Deleted

## 2019-05-20 ENCOUNTER — Other Ambulatory Visit: Payer: Self-pay

## 2019-05-20 ENCOUNTER — Encounter (HOSPITAL_COMMUNITY): Payer: Self-pay

## 2019-05-20 VITALS — BP 113/56 | HR 87 | Temp 97.5°F

## 2019-05-20 DIAGNOSIS — Z362 Encounter for other antenatal screening follow-up: Secondary | ICD-10-CM

## 2019-05-20 DIAGNOSIS — O09529 Supervision of elderly multigravida, unspecified trimester: Secondary | ICD-10-CM | POA: Diagnosis not present

## 2019-05-20 DIAGNOSIS — Z3A38 38 weeks gestation of pregnancy: Secondary | ICD-10-CM

## 2019-05-20 DIAGNOSIS — O0943 Supervision of pregnancy with grand multiparity, third trimester: Secondary | ICD-10-CM | POA: Diagnosis not present

## 2019-05-20 DIAGNOSIS — O09523 Supervision of elderly multigravida, third trimester: Secondary | ICD-10-CM

## 2019-05-28 ENCOUNTER — Ambulatory Visit (HOSPITAL_COMMUNITY): Payer: Medicaid Other | Attending: Obstetrics and Gynecology

## 2019-05-28 ENCOUNTER — Other Ambulatory Visit: Payer: Self-pay | Admitting: Women's Health

## 2019-05-28 ENCOUNTER — Ambulatory Visit (HOSPITAL_COMMUNITY): Admission: RE | Admit: 2019-05-28 | Payer: Medicaid Other | Source: Ambulatory Visit

## 2019-05-28 ENCOUNTER — Inpatient Hospital Stay (HOSPITAL_COMMUNITY)
Admission: EM | Admit: 2019-05-28 | Discharge: 2019-05-29 | Payer: Medicaid Other | Attending: Obstetrics & Gynecology | Admitting: Obstetrics & Gynecology

## 2019-05-28 ENCOUNTER — Encounter (HOSPITAL_COMMUNITY): Payer: Self-pay

## 2019-05-28 ENCOUNTER — Other Ambulatory Visit: Payer: Self-pay

## 2019-05-28 DIAGNOSIS — Y939 Activity, unspecified: Secondary | ICD-10-CM | POA: Insufficient documentation

## 2019-05-28 DIAGNOSIS — O9A212 Injury, poisoning and certain other consequences of external causes complicating pregnancy, second trimester: Secondary | ICD-10-CM | POA: Diagnosis not present

## 2019-05-28 DIAGNOSIS — Z79899 Other long term (current) drug therapy: Secondary | ICD-10-CM | POA: Insufficient documentation

## 2019-05-28 DIAGNOSIS — Z3A24 24 weeks gestation of pregnancy: Secondary | ICD-10-CM | POA: Diagnosis not present

## 2019-05-28 DIAGNOSIS — S3991XA Unspecified injury of abdomen, initial encounter: Secondary | ICD-10-CM | POA: Insufficient documentation

## 2019-05-28 DIAGNOSIS — H9209 Otalgia, unspecified ear: Secondary | ICD-10-CM | POA: Diagnosis not present

## 2019-05-28 DIAGNOSIS — S0990XA Unspecified injury of head, initial encounter: Secondary | ICD-10-CM | POA: Insufficient documentation

## 2019-05-28 DIAGNOSIS — O9A213 Injury, poisoning and certain other consequences of external causes complicating pregnancy, third trimester: Secondary | ICD-10-CM

## 2019-05-28 DIAGNOSIS — Y999 Unspecified external cause status: Secondary | ICD-10-CM | POA: Diagnosis not present

## 2019-05-28 DIAGNOSIS — Y929 Unspecified place or not applicable: Secondary | ICD-10-CM | POA: Diagnosis not present

## 2019-05-28 DIAGNOSIS — R52 Pain, unspecified: Secondary | ICD-10-CM | POA: Diagnosis not present

## 2019-05-28 DIAGNOSIS — Z87891 Personal history of nicotine dependence: Secondary | ICD-10-CM | POA: Diagnosis not present

## 2019-05-28 DIAGNOSIS — Z3A39 39 weeks gestation of pregnancy: Secondary | ICD-10-CM

## 2019-05-28 DIAGNOSIS — T7411XA Adult physical abuse, confirmed, initial encounter: Secondary | ICD-10-CM | POA: Diagnosis not present

## 2019-05-28 DIAGNOSIS — Z3689 Encounter for other specified antenatal screening: Secondary | ICD-10-CM

## 2019-05-28 DIAGNOSIS — O09529 Supervision of elderly multigravida, unspecified trimester: Secondary | ICD-10-CM

## 2019-05-28 DIAGNOSIS — Z3493 Encounter for supervision of normal pregnancy, unspecified, third trimester: Secondary | ICD-10-CM

## 2019-05-28 DIAGNOSIS — I959 Hypotension, unspecified: Secondary | ICD-10-CM | POA: Diagnosis not present

## 2019-05-28 LAB — OB RESULTS CONSOLE GBS: GBS: NEGATIVE
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
## 2019-05-28 NOTE — Progress Notes (Signed)
IOL orders entered.  Marylen Ponto, NP  11:26 PM 05/28/2019
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
## 2019-05-29 ENCOUNTER — Inpatient Hospital Stay (HOSPITAL_BASED_OUTPATIENT_CLINIC_OR_DEPARTMENT_OTHER): Payer: Medicaid Other

## 2019-05-29 DIAGNOSIS — O9A212 Injury, poisoning and certain other consequences of external causes complicating pregnancy, second trimester: Secondary | ICD-10-CM

## 2019-05-29 DIAGNOSIS — O09523 Supervision of elderly multigravida, third trimester: Secondary | ICD-10-CM | POA: Diagnosis not present

## 2019-05-29 DIAGNOSIS — Z3A24 24 weeks gestation of pregnancy: Secondary | ICD-10-CM

## 2019-05-29 DIAGNOSIS — Z3A39 39 weeks gestation of pregnancy: Secondary | ICD-10-CM

## 2019-05-29 NOTE — Discharge Instructions (Signed)
Abdominal Pain During Pregnancy  Abdominal pain is common during pregnancy, and has many possible causes. Some causes are more serious than others, and sometimes the cause is not known. Abdominal pain can be a sign that labor is starting. It can also be caused by normal growth and stretching of muscles and ligaments during pregnancy. Always tell your health care provider if you have any abdominal pain. Follow these instructions at home:  Do not have sex or put anything in your vagina until your pain goes away completely.  Get plenty of rest until your pain improves.  Drink enough fluid to keep your urine pale yellow.  Take over-the-counter and prescription medicines only as told by your health care provider.  Keep all follow-up visits as told by your health care provider. This is important. Contact a health care provider if:  Your pain continues or gets worse after resting.  You have lower abdominal pain that: ? Comes and goes at regular intervals. ? Spreads to your back. ? Is similar to menstrual cramps.  You have pain or burning when you urinate. Get help right away if:  You have a fever or chills.  You have vaginal bleeding.  You are leaking fluid from your vagina.  You are passing tissue from your vagina.  You have vomiting or diarrhea that lasts for more than 24 hours.  Your baby is moving less than usual.  You feel very weak or faint.  You have shortness of breath.  You develop severe pain in your upper abdomen. Summary  Abdominal pain is common during pregnancy, and has many possible causes.  If you experience abdominal pain during pregnancy, tell your health care provider right away.  Follow your health care provider's home care instructions and keep all follow-up visits as directed. This information is not intended to replace advice given to you by your health care provider. Make sure you discuss any questions you have with your health care  provider. Document Revised: 08/10/2018 Document Reviewed: 07/25/2016 Elsevier Patient Education  2020 [employer]  Placental Abruption  The placenta is the organ formed during pregnancy. It carries oxygen and nutrients to the unborn baby (fetus). The placenta is the baby's life support system. In normal circumstances, it remains attached to the inside of the uterus until the baby is born. Placental abruption is a condition in which the placenta partly or completely separates from the uterus before the baby is born. Placental abruption is rare, but it can happen any time after 20 weeks of pregnancy. A small separation may not cause problems, but a large separation may be dangerous for you and your baby. A large separation is usually an emergency that requires treatment right away. What are the causes? In most cases, the cause of this condition is not known. What increases the risk? This condition is more likely to develop in women who:  Have experienced a recent trauma such as a fall, an injury to the abdomen, or a car accident.  Have had a previous placental abruption.  Have high blood pressure.  Smoke cigarettes, use alcohol, or use drugs, such as cocaine.  Have multiples (twins, triplets, or more).  Have too much amniotic fluid (polyhydramnios).  Are 41 years of age or older. What are the signs or symptoms? Symptoms of this condition can be mild or severe. A small placental abruption may not cause symptoms, or it may cause mild symptoms, which may include:  Mild pain in the abdomen or lower back.  Slight  bleeding in the vagina. A severe placental abruption will cause symptoms. The symptoms will depend on the size of the separation and the stage of pregnancy. They may include:  Severe pain in the abdomen or lower back.  Bleeding from the vagina.  Ongoing contractions of your uterus with little or no relaxation of your uterus between contractions. How is this  diagnosed? There are no tests to diagnose placental abruption. However, your health care provider may suspect that you have this condition based on:  Your symptoms.  A physical exam.  Ultrasound findings.  Blood tests. How is this treated? Treatment for placental abruption depends on the severity of the condition.  Mild cases may be monitored through close observation. You may be admitted to the hospital during this time.  Severe cases may require emergency treatment. This may involve: ? Admission to the hospital. ? Emergency cesarean delivery of your baby. ? A blood transfusion or other fluids given through an IV. Follow these instructions at home: Activity  Get plenty of rest and sleep.  Do not have sex until your health care provider says it is okay. Lifestyle  Do not use drugs.  Do not drink alcohol.  Do not use tampons or douche unless your health care provider says it is okay.  Do not use any products that contain nicotine or tobacco, such as cigarettes, e-cigarettes, and chewing tobacco. If you need help quitting, ask your health care provider. General instructions  Take over-the-counter and prescription medicines only as told by your health care provider.  Do not take any medicines that your health care provider has not approved.  Arrange for help at home so you can get adequate rest.  Keep all follow-up visits as told by your health care provider. This is important. Contact a health care provider if:  You have vaginal spotting.  You are having mild, regular contractions. Get help right away if:  You have moderate or heavy vaginal bleeding or spotting.  You have severe pain in the abdomen.  You have continuous uterine contractions.  You have a hard, tender uterus.  You have any type of trauma, such as an injury to the abdomen, a fall, or a car accident.  You do not feel the baby move, or the baby moves very little. Summary  Placental abruption is  a condition in which the placenta partly or completely separates from the uterus before the baby is born.  Placental abruption is a medical emergency that requires treatment right away.  Contact a health care provider if you have vaginal bleeding, or if you have mild, regular contractions.  Get help right away if you have heavy vaginal bleeding, severe pain in the abdomen, continuous uterine contractions, or you do not feel the baby move.  Keep all follow-up visits as told by your health care provider. This is important. This information is not intended to replace advice given to you by your health care provider. Make sure you discuss any questions you have with your health care provider. Document Revised: 10/15/2018 Document Reviewed: 10/15/2018 Elsevier Patient Education  2020 [employer]  Vaginal Bleeding During Pregnancy, Third Trimester  A small amount of bleeding from the vagina (spotting) is relatively common during pregnancy. Various things can cause bleeding or spotting during pregnancy. Sometimes bleeding is normal and is not a problem. However, bleeding during the third trimester can also be a sign of something serious for the mother and the baby. Be sure to tell your health care provider about  any vaginal bleeding right away. Some possible causes of vaginal bleeding during the third trimester include:  Infection or growths (polyps) on the cervix.  A condition in which the placenta partially or completely covers the opening of the cervix inside the uterus (placenta previa).  The placenta separating from the uterus (placenta abruption).  The start of labor (discharging of the mucus plug).  A condition in which the placenta grows into the muscle layer of the uterus (placenta accreta). Follow these instructions at home: Activity  Follow instructions from your health care provider about limiting your activity. If your health care provider recommends activity restriction, you  may need to stay in bed and only get up to use the bathroom. In some cases, your health care provider may allow you to continue light activity.  If needed, make plans for someone to help with your regular activities.  Ask your health care provider if it is safe for you to drive.  Do not lift anything that is heavier than 10 lb (4.5 kg), or the limit that your health care provider tells you, until he or she says that it is safe.  Do not have sex or orgasms until your health care provider says that this is safe. Medicines  Take over-the-counter and prescription medicines only as told by your health care provider.  Do not take aspirin because it can cause bleeding. General instructions  Pay attention to any changes in your symptoms.  Write down how many pads you use each day, how often you change pads, and how soaked (saturated) they are.  Do not use tampons or douche.  If you pass any tissue from your vagina, save the tissue so you can show it to your health care provider.  Keep all follow-up visits as told by your health care provider. This is important. Contact a health care provider if:  You have vaginal bleeding during any part of your pregnancy.  You have cramps or labor pains.  You have a fever. Get help right away if:  You have severe cramps or pain in your back or abdomen.  You have a gush of fluid from the vagina.  You pass large clots or a large amount of tissue from your vagina.  Your bleeding increases.  You feel light-headed or weak.  You faint.  You feel that your baby is moving less than usual, or not moving at all. Summary  Various things can cause bleeding or spotting in pregnancy.  Bleeding during the third trimester can be a sign of a serious problem for the mother and the baby.  Be sure to tell your health care provider about any vaginal bleeding right away. This information is not intended to replace advice given to you by your health care  provider. Make sure you discuss any questions you have with your health care provider. Document Revised: 08/11/2018 Document Reviewed: 07/25/2016 Elsevier Patient Education  2020 [employer].  Third Trimester of Pregnancy The third trimester is from week 28 through week 40 (months 7 through 9). The third trimester is a time when the unborn baby (fetus) is growing rapidly. At the end of the ninth month, the fetus is about 20 inches in length and weighs 6-10 pounds. Body changes during your third trimester Your body will continue to go through many changes during pregnancy. The changes vary from woman to woman. During the third trimester:  Your weight will continue to increase. You can expect to gain 25-35 pounds (11-16 kg) by the  end of the pregnancy.  You may begin to get stretch marks on your hips, abdomen, and breasts.  You may urinate more often because the fetus is moving lower into your pelvis and pressing on your bladder.  You may develop or continue to have heartburn. This is caused by increased hormones that slow down muscles in the digestive tract.  You may develop or continue to have constipation because increased hormones slow digestion and cause the muscles that push waste through your intestines to relax.  You may develop hemorrhoids. These are swollen veins (varicose veins) in the rectum that can itch or be painful.  You may develop swollen, bulging veins (varicose veins) in your legs.  You may have increased body aches in the pelvis, back, or thighs. This is due to weight gain and increased hormones that are relaxing your joints.  You may have changes in your hair. These can include thickening of your hair, rapid growth, and changes in texture. Some women also have hair loss during or after pregnancy, or hair that feels dry or thin. Your hair will most likely return to normal after your baby is born.  Your breasts will continue to grow and they will continue to become  tender. A yellow fluid (colostrum) may leak from your breasts. This is the first milk you are producing for your baby.  Your belly button may stick out.  You may notice more swelling in your hands, face, or ankles.  You may have increased tingling or numbness in your hands, arms, and legs. The skin on your belly may also feel numb.  You may feel short of breath because of your expanding uterus.  You may have more problems sleeping. This can be caused by the size of your belly, increased need to urinate, and an increase in your body's metabolism.  You may notice the fetus "dropping," or moving lower in your abdomen (lightening).  You may have increased vaginal discharge.  You may notice your joints feel loose and you may have pain around your pelvic bone. What to expect at prenatal visits You will have prenatal exams every 2 weeks until week 36. Then you will have weekly prenatal exams. During a routine prenatal visit:  You will be weighed to make sure you and the baby are growing normally.  Your blood pressure will be taken.  Your abdomen will be measured to track your baby's growth.  The fetal heartbeat will be listened to.  Any test results from the previous visit will be discussed.  You may have a cervical check near your due date to see if your cervix has softened or thinned (effaced).  You will be tested for Group B streptococcus. This happens between 35 and 37 weeks. Your health care provider may ask you:  What your birth plan is.  How you are feeling.  If you are feeling the baby move.  If you have had any abnormal symptoms, such as leaking fluid, bleeding, severe headaches, or abdominal cramping.  If you are using any tobacco products, including cigarettes, chewing tobacco, and electronic cigarettes.  If you have any questions. Other tests or screenings that may be performed during your third trimester include:  Blood tests that check for low iron levels  (anemia).  Fetal testing to check the health, activity level, and growth of the fetus. Testing is done if you have certain medical conditions or if there are problems during the pregnancy.  Nonstress test (NST). This test checks the health of  your baby to make sure there are no signs of problems, such as the baby not getting enough oxygen. During this test, a belt is placed around your belly. The baby is made to move, and its heart rate is monitored during movement. What is false labor? False labor is a condition in which you feel small, irregular tightenings of the muscles in the womb (contractions) that usually go away with rest, changing position, or drinking water. These are called Braxton Hicks contractions. Contractions may last for hours, days, or even weeks before true labor sets in. If contractions come at regular intervals, become more frequent, increase in intensity, or become painful, you should see your health care provider. What are the signs of labor?  Abdominal cramps.  Regular contractions that start at 10 minutes apart and become stronger and more frequent with time.  Contractions that start on the top of the uterus and spread down to the lower abdomen and back.  Increased pelvic pressure and dull back pain.  A watery or bloody mucus discharge that comes from the vagina.  Leaking of amniotic fluid. This is also known as your "water breaking." It could be a slow trickle or a gush. Let your health care provider know if it has a color or strange odor. If you have any of these signs, call your health care provider right away, even if it is before your due date. Follow these instructions at home: Medicines  Follow your health care provider's instructions regarding medicine use. Specific medicines may be either safe or unsafe to take during pregnancy.  Take a prenatal vitamin that contains at least 600 micrograms (mcg) of folic acid.  If you develop constipation, try taking a  stool softener if your health care provider approves. Eating and drinking   Eat a balanced diet that includes fresh fruits and vegetables, whole grains, good sources of protein such as meat, eggs, or tofu, and low-fat dairy. Your health care provider will help you determine the amount of weight gain that is right for you.  Avoid raw meat and uncooked cheese. These carry germs that can cause birth defects in the baby.  If you have low calcium intake from food, talk to your health care provider about whether you should take a daily calcium supplement.  Eat four or five small meals rather than three large meals a day.  Limit foods that are high in fat and processed sugars, such as fried and sweet foods.  To prevent constipation: ? Drink enough fluid to keep your urine clear or pale yellow. ? Eat foods that are high in fiber, such as fresh fruits and vegetables, whole grains, and beans. Activity  Exercise only as directed by your health care provider. Most women can continue their usual exercise routine during pregnancy. Try to exercise for 30 minutes at least 5 days a week. Stop exercising if you experience uterine contractions.  Avoid heavy lifting.  Do not exercise in extreme heat or humidity, or at high altitudes.  Wear low-heel, comfortable shoes.  Practice good posture.  You may continue to have sex unless your health care provider tells you otherwise. Relieving pain and discomfort  Take frequent breaks and rest with your legs elevated if you have leg cramps or low back pain.  Take warm sitz baths to soothe any pain or discomfort caused by hemorrhoids. Use hemorrhoid cream if your health care provider approves.  Wear a good support bra to prevent discomfort from breast tenderness.  If you develop  varicose veins: ? Wear support pantyhose or compression stockings as told by your healthcare provider. ? Elevate your feet for 15 minutes, 3-4 times a day. Prenatal care  Write  down your questions. Take them to your prenatal visits.  Keep all your prenatal visits as told by your health care provider. This is important. Safety  Wear your seat belt at all times when driving.  Make a list of emergency phone numbers, including numbers for family, friends, the hospital, and police and fire departments. General instructions  Avoid cat litter boxes and soil used by cats. These carry germs that can cause birth defects in the baby. If you have a cat, ask someone to clean the litter box for you.  Do not travel far distances unless it is absolutely necessary and only with the approval of your health care provider.  Do not use hot tubs, steam rooms, or saunas.  Do not drink alcohol.  Do not use any products that contain nicotine or tobacco, such as cigarettes and e-cigarettes. If you need help quitting, ask your health care provider.  Do not use any medicinal herbs or unprescribed drugs. These chemicals affect the formation and growth of the baby.  Do not douche or use tampons or scented sanitary pads.  Do not cross your legs for long periods of time.  To prepare for the arrival of your baby: ? Take prenatal classes to understand, practice, and ask questions about labor and delivery. ? Make a trial run to the hospital. ? Visit the hospital and tour the maternity area. ? Arrange for maternity or paternity leave through employers. ? Arrange for family and friends to take care of pets while you are in the hospital. ? Purchase a rear-facing car seat and make sure you know how to install it in your car. ? Pack your hospital bag. ? Prepare the baby's nursery. Make sure to remove all pillows and stuffed animals from the baby's crib to prevent suffocation.  Visit your dentist if you have not gone during your pregnancy. Use a soft toothbrush to brush your teeth and be gentle when you floss. Contact a health care provider if:  You are unsure if you are in labor or if your  water has broken.  You become dizzy.  You have mild pelvic cramps, pelvic pressure, or nagging pain in your abdominal area.  You have lower back pain.  You have persistent nausea, vomiting, or diarrhea.  You have an unusual or bad smelling vaginal discharge.  You have pain when you urinate. Get help right away if:  Your water breaks before 37 weeks.  You have regular contractions less than 5 minutes apart before 37 weeks.  You have a fever.  You are leaking fluid from your vagina.  You have spotting or bleeding from your vagina.  You have severe abdominal pain or cramping.  You have rapid weight loss or weight gain.  You have shortness of breath with chest pain.  You notice sudden or extreme swelling of your face, hands, ankles, feet, or legs.  Your baby makes fewer than 10 movements in 2 hours.  You have severe headaches that do not go away when you take medicine.  You have vision changes. Summary  The third trimester is from week 28 through week 40, months 7 through 9. The third trimester is a time when the unborn baby (fetus) is growing rapidly.  During the third trimester, your discomfort may increase as you and your baby continue to  gain weight. You may have abdominal, leg, and back pain, sleeping problems, and an increased need to urinate.  During the third trimester your breasts will keep growing and they will continue to become tender. A yellow fluid (colostrum) may leak from your breasts. This is the first milk you are producing for your baby.  False labor is a condition in which you feel small, irregular tightenings of the muscles in the womb (contractions) that eventually go away. These are called Braxton Hicks contractions. Contractions may last for hours, days, or even weeks before true labor sets in.  Signs of labor can include: abdominal cramps; regular contractions that start at 10 minutes apart and become stronger and more frequent with time; watery or  bloody mucus discharge that comes from the vagina; increased pelvic pressure and dull back pain; and leaking of amniotic fluid. This information is not intended to replace advice given to you by your health care provider. Make sure you discuss any questions you have with your health care provider. Document Revised: 08/13/2018 Document Reviewed: 05/28/2016 Elsevier Patient Education  2020 [employer].  Signs and Symptoms of Labor Labor is your body's natural process of moving your baby, placenta, and umbilical cord out of your uterus. The process of labor usually starts when your baby is full-term, between 17 and 40 weeks of pregnancy. How will I know when I am close to going into labor? As your body prepares for labor and the birth of your baby, you may notice the following symptoms in the weeks and days before true labor starts:  Having a strong desire to get your home ready to receive your new baby. This is called nesting. Nesting may be a sign that labor is approaching, and it may occur several weeks before birth. Nesting may involve cleaning and organizing your home.  Passing a small amount of thick, bloody mucus out of your vagina (normal bloody show or losing your mucus plug). This may happen more than a week before labor begins, or it might occur right before labor begins as the opening of the cervix starts to widen (dilate). For some women, the entire mucus plug passes at once. For others, smaller portions of the mucus plug may gradually pass over several days.  Your baby moving (dropping) lower in your pelvis to get into position for birth (lightening). When this happens, you may feel more pressure on your bladder and pelvic bone and less pressure on your ribs. This may make it easier to breathe. It may also cause you to need to urinate more often and have problems with bowel movements.  Having "practice contractions" (Braxton Hicks contractions) that occur at irregular (unevenly spaced)  intervals that are more than 10 minutes apart. This is also called false labor. False labor contractions are common after exercise or sexual activity, and they will stop if you change position, rest, or drink fluids. These contractions are usually mild and do not get stronger over time. They may feel like: ? A backache or back pain. ? Mild cramps, similar to menstrual cramps. ? Tightening or pressure in your abdomen. Other early symptoms that labor may be starting soon include:  Nausea or loss of appetite.  Diarrhea.  Having a sudden burst of energy, or feeling very tired.  Mood changes.  Having trouble sleeping. How will I know when labor has begun? Signs that true labor has begun may include:  Having contractions that come at regular (evenly spaced) intervals and increase in intensity. This may  feel like more intense tightening or pressure in your abdomen that moves to your back. ? Contractions may also feel like rhythmic pain in your upper thighs or back that comes and goes at regular intervals. ? For first-time mothers, this change in intensity of contractions often occurs at a more gradual pace. ? Women who have given birth before may notice a more rapid progression of contraction changes.  Having a feeling of pressure in the vaginal area.  Your water breaking (rupture of membranes). This is when the sac of fluid that surrounds your baby breaks. When this happens, you will notice fluid leaking from your vagina. This may be clear or blood-tinged. Labor usually starts within 24 hours of your water breaking, but it may take longer to begin. ? Some women notice this as a gush of fluid. ? Others notice that their underwear repeatedly becomes damp. Follow these instructions at home:   When labor starts, or if your water breaks, call your health care provider or nurse care line. Based on your situation, they will determine when you should go in for an exam.  When you are in early  labor, you may be able to rest and manage symptoms at home. Some strategies to try at home include: ? Breathing and relaxation techniques. ? Taking a warm bath or shower. ? Listening to music. ? Using a heating pad on the lower back for pain. If you are directed to use heat:  Place a towel between your skin and the heat source.  Leave the heat on for 20-30 minutes.  Remove the heat if your skin turns bright red. This is especially important if you are unable to feel pain, heat, or cold. You may have a greater risk of getting burned. Get help right away if:  You have painful, regular contractions that are 5 minutes apart or less.  Labor starts before you are [redacted] weeks along in your pregnancy.  You have a fever.  You have a headache that does not go away.  You have bright red blood coming from your vagina.  You do not feel your baby moving.  You have a sudden onset of: ? Severe headache with vision problems. ? Nausea, vomiting, or diarrhea. ? Chest pain or shortness of breath. These symptoms may be an emergency. If your health care provider recommends that you go to the hospital or birth center where you plan to deliver, do not drive yourself. Have someone else drive you, or call emergency services (911 in the [place] Summary  Labor is your body's natural process of moving your baby, placenta, and umbilical cord out of your uterus.  The process of labor usually starts when your baby is full-term, between 6837 and 40 weeks of pregnancy.  When labor starts, or if your water breaks, call your health care provider or nurse care line. Based on your situation, they will determine when you should go in for an exam. This information is not intended to replace advice given to you by your health care provider. Make sure you discuss any questions you have with your health care provider. Document Revised: 01/20/2017 Document Reviewed: 09/27/2016 Elsevier Patient Education  2020 [employer]

## 2019-05-30 ENCOUNTER — Other Ambulatory Visit: Payer: Self-pay | Admitting: Family Medicine

## 2019-05-31 ENCOUNTER — Other Ambulatory Visit (HOSPITAL_COMMUNITY)
Admission: RE | Admit: 2019-05-31 | Discharge: 2019-05-31 | Disposition: A | Discharge status: Home / Self Care | Payer: Medicaid Other | Source: Ambulatory Visit | Attending: Family Medicine | Admitting: Family Medicine

## 2019-05-31 ENCOUNTER — Telehealth (HOSPITAL_COMMUNITY): Payer: Self-pay | Admitting: *Deleted

## 2019-05-31 ENCOUNTER — Encounter: Payer: Medicaid Other | Admitting: Advanced Practice Midwife

## 2019-05-31 ENCOUNTER — Encounter (HOSPITAL_COMMUNITY): Payer: Self-pay | Admitting: *Deleted

## 2019-05-31 DIAGNOSIS — Z01812 Encounter for preprocedural laboratory examination: Secondary | ICD-10-CM | POA: Diagnosis not present

## 2019-05-31 DIAGNOSIS — Z20822 Contact with and (suspected) exposure to covid-19: Secondary | ICD-10-CM | POA: Insufficient documentation

## 2019-05-31 LAB — SARS CORONAVIRUS 2 (TAT 6-24 HRS): SARS Coronavirus 2: NEGATIVE

## 2019-05-31 LAB — CULTURE, BETA STREP (GROUP B ONLY)

## 2019-05-31 NOTE — Telephone Encounter (Signed)
Preadmission screen  

## 2019-06-02 ENCOUNTER — Inpatient Hospital Stay (HOSPITAL_COMMUNITY): Payer: Medicaid Other

## 2019-06-02 ENCOUNTER — Inpatient Hospital Stay (HOSPITAL_COMMUNITY)
Admission: AD | Admit: 2019-06-02 | Discharge: 2019-06-04 | DRG: 798 | Disposition: A | Discharge status: Home / Self Care | Payer: Medicaid Other | Attending: Obstetrics & Gynecology | Admitting: Obstetrics & Gynecology

## 2019-06-02 ENCOUNTER — Inpatient Hospital Stay (HOSPITAL_COMMUNITY): Payer: Medicaid Other | Admitting: Anesthesiology

## 2019-06-02 ENCOUNTER — Encounter (HOSPITAL_COMMUNITY): Payer: Self-pay | Admitting: Obstetrics and Gynecology

## 2019-06-02 ENCOUNTER — Other Ambulatory Visit: Payer: Self-pay

## 2019-06-02 DIAGNOSIS — Z641 Problems related to multiparity: Secondary | ICD-10-CM

## 2019-06-02 DIAGNOSIS — Z3A40 40 weeks gestation of pregnancy: Secondary | ICD-10-CM

## 2019-06-02 DIAGNOSIS — R87618 Other abnormal cytological findings on specimens from cervix uteri: Secondary | ICD-10-CM | POA: Diagnosis present

## 2019-06-02 DIAGNOSIS — Z302 Encounter for sterilization: Secondary | ICD-10-CM | POA: Diagnosis not present

## 2019-06-02 DIAGNOSIS — O09529 Supervision of elderly multigravida, unspecified trimester: Secondary | ICD-10-CM

## 2019-06-02 DIAGNOSIS — O26893 Other specified pregnancy related conditions, third trimester: Secondary | ICD-10-CM | POA: Diagnosis present

## 2019-06-02 DIAGNOSIS — O480 Post-term pregnancy: Secondary | ICD-10-CM | POA: Diagnosis present

## 2019-06-02 DIAGNOSIS — O0990 Supervision of high risk pregnancy, unspecified, unspecified trimester: Secondary | ICD-10-CM

## 2019-06-02 DIAGNOSIS — Z3009 Encounter for other general counseling and advice on contraception: Secondary | ICD-10-CM | POA: Diagnosis present

## 2019-06-02 DIAGNOSIS — Z87891 Personal history of nicotine dependence: Secondary | ICD-10-CM | POA: Diagnosis not present

## 2019-06-02 DIAGNOSIS — R8789 Other abnormal findings in specimens from female genital organs: Secondary | ICD-10-CM | POA: Diagnosis present

## 2019-06-02 LAB — CBC
HCT: 30.2 % — ABNORMAL LOW (ref 36.0–46.0)
Hemoglobin: 10.3 g/dL — ABNORMAL LOW (ref 12.0–15.0)
MCH: 31 pg (ref 26.0–34.0)
MCHC: 34.1 g/dL (ref 30.0–36.0)
MCV: 91 fL (ref 80.0–100.0)
Platelets: 223 10*3/uL (ref 150–400)
RBC: 3.32 MIL/uL — ABNORMAL LOW (ref 3.87–5.11)
RDW: 14.9 % (ref 11.5–15.5)
WBC: 10 10*3/uL (ref 4.0–10.5)
nRBC: 0 % (ref 0.0–0.2)

## 2019-06-02 MED ORDER — TERBUTALINE SULFATE 1 MG/ML IJ SOLN: 0.2500 mg | Freq: Once | INTRAMUSCULAR | Status: DC | PRN

## 2019-06-02 MED ORDER — EPHEDRINE 5 MG/ML INJ: 10.0000 mg | INTRAVENOUS | Status: DC | PRN

## 2019-06-02 MED ORDER — ACETAMINOPHEN 325 MG PO TABS: 650.0000 mg | ORAL_TABLET | ORAL | Status: DC | PRN

## 2019-06-02 MED ORDER — SOD CITRATE-CITRIC ACID 500-334 MG/5ML PO SOLN: 30.0000 mL | ORAL | Status: DC | PRN

## 2019-06-02 MED ORDER — SODIUM CHLORIDE (PF) 0.9 % IJ SOLN
INTRAMUSCULAR | Status: DC | PRN
  Administered 2019-06-02: 12 mL/h via EPIDURAL

## 2019-06-02 MED ORDER — LACTATED RINGERS IV SOLN
500.0000 mL | INTRAVENOUS | Status: DC | PRN
  Administered 2019-06-02: 500 mL via INTRAVENOUS

## 2019-06-02 MED ORDER — PHENYLEPHRINE 40 MCG/ML (10ML) SYRINGE FOR IV PUSH (FOR BLOOD PRESSURE SUPPORT): 80.0000 ug | PREFILLED_SYRINGE | INTRAVENOUS | Status: DC | PRN

## 2019-06-02 MED ORDER — LIDOCAINE HCL (PF) 1 % IJ SOLN: 30.0000 mL | INTRAMUSCULAR | Status: DC | PRN

## 2019-06-02 MED ORDER — MISOPROSTOL 50MCG HALF TABLET
50.0000 ug | ORAL_TABLET | ORAL | Status: DC | PRN
  Administered 2019-06-02 (×3): 50 ug via BUCCAL
  Filled 2019-06-02 (×3): qty 1

## 2019-06-02 MED ORDER — LACTATED RINGERS IV SOLN: INTRAVENOUS | Status: DC

## 2019-06-02 MED ORDER — FENTANYL-BUPIVACAINE-NACL 0.5-0.125-0.9 MG/250ML-% EP SOLN: 12.0000 mL/h | EPIDURAL | Status: DC | PRN

## 2019-06-02 MED ORDER — DIPHENHYDRAMINE HCL 50 MG/ML IJ SOLN: 12.5000 mg | INTRAMUSCULAR | Status: DC | PRN

## 2019-06-02 MED ORDER — OXYTOCIN 40 UNITS IN NORMAL SALINE INFUSION - SIMPLE MED
2.5000 [IU]/h | INTRAVENOUS | Status: DC
  Filled 2019-06-02: qty 1000

## 2019-06-02 MED ORDER — LIDOCAINE-EPINEPHRINE (PF) 2 %-1:200000 IJ SOLN
INTRAMUSCULAR | Status: DC | PRN
  Administered 2019-06-02 (×2): 2 mL via EPIDURAL

## 2019-06-02 MED ORDER — OXYTOCIN BOLUS FROM INFUSION
500.0000 mL | Freq: Once | INTRAVENOUS | Status: AC
  Administered 2019-06-03: 02:00:00 500 mL/h via INTRAVENOUS

## 2019-06-02 MED ORDER — FENTANYL-BUPIVACAINE-NACL 0.5-0.125-0.9 MG/250ML-% EP SOLN
EPIDURAL | Status: AC
  Filled 2019-06-02: qty 250

## 2019-06-02 MED ORDER — OXYCODONE-ACETAMINOPHEN 5-325 MG PO TABS: 2.0000 | ORAL_TABLET | ORAL | Status: DC | PRN

## 2019-06-02 MED ORDER — OXYTOCIN 40 UNITS IN NORMAL SALINE INFUSION - SIMPLE MED
1.0000 m[IU]/min | INTRAVENOUS | Status: DC
  Administered 2019-06-02: 2 m[IU]/min via INTRAVENOUS

## 2019-06-02 MED ORDER — LACTATED RINGERS IV SOLN: 500.0000 mL | Freq: Once | INTRAVENOUS | Status: DC

## 2019-06-02 MED ORDER — OXYCODONE-ACETAMINOPHEN 5-325 MG PO TABS: 1.0000 | ORAL_TABLET | ORAL | Status: DC | PRN

## 2019-06-02 MED ORDER — ONDANSETRON HCL 4 MG/2ML IJ SOLN
4.0000 mg | Freq: Four times a day (QID) | INTRAMUSCULAR | Status: DC | PRN
  Administered 2019-06-03: 4 mg via INTRAVENOUS
  Filled 2019-06-02: qty 2

## 2019-06-02 NOTE — Anesthesia Procedure Notes (Signed)
Epidural Patient location during procedure: OB Start time: 06/02/2019 11:35 PM End time: 06/02/2019 11:50 PM  Staffing Anesthesiologist: Elmer Picker, MD Performed: anesthesiologist   Preanesthetic Checklist Completed: patient identified, IV checked, risks and benefits discussed, monitors and equipment checked, pre-op evaluation and timeout performed  Epidural Patient position: sitting Prep: DuraPrep and site prepped and draped Patient monitoring: continuous pulse ox, blood pressure, heart rate and cardiac monitor Approach: midline Location: L3-L4 Injection technique: LOR air  Needle:  Needle type: Tuohy  Needle gauge: 17 G Needle length: 9 cm Needle insertion depth: 6 cm Catheter type: closed end flexible Catheter size: 19 Gauge Catheter at skin depth: 12 cm Test dose: negative  Assessment Sensory level: T8 Events: blood not aspirated, injection not painful, no injection resistance, no paresthesia and negative IV test  Additional Notes Patient identified. Risks/Benefits/Options discussed with patient including but not limited to bleeding, infection, nerve damage, paralysis, failed block, incomplete pain control, headache, blood pressure changes, nausea, vomiting, reactions to medication both or allergic, itching and postpartum back pain. Confirmed with bedside nurse the patient's most recent platelet count. Confirmed with patient that they are not currently taking any anticoagulation, have any bleeding history or any family history of bleeding disorders. Patient expressed understanding and wished to proceed. All questions were answered. Sterile technique was used throughout the entire procedure. Please see nursing notes for vital signs. Test dose was given through epidural catheter and negative prior to continuing to dose epidural or start infusion. Warning signs of high block given to the patient including shortness of breath, tingling/numbness in hands, complete motor block,  or any concerning symptoms with instructions to call for help. Patient was given instructions on fall risk and not to get out of bed. All questions and concerns addressed with instructions to call with any issues or inadequate analgesia.  Reason for block:procedure for pain

## 2019-06-02 NOTE — Anesthesia Preprocedure Evaluation (Signed)
Anesthesia Evaluation  Patient identified by MRN, date of birth, ID band Patient awake    Reviewed: Allergy & Precautions, NPO status , Patient's Chart, lab work & pertinent test results  Airway Mallampati: II  TM Distance: >3 FB Neck ROM: Full    Dental no notable dental hx.    Pulmonary neg pulmonary ROS, former smoker,    Pulmonary exam normal breath sounds clear to auscultation       Cardiovascular negative cardio ROS Normal cardiovascular exam Rhythm:Regular Rate:Normal     Neuro/Psych negative neurological ROS  negative psych ROS   GI/Hepatic negative GI ROS, Neg liver ROS,   Endo/Other  negative endocrine ROS  Renal/GU negative Renal ROS  negative genitourinary   Musculoskeletal negative musculoskeletal ROS (+)   Abdominal   Peds  Hematology negative hematology ROS (+)   Anesthesia Other Findings AMA, grand multiparity  Reproductive/Obstetrics (+) Pregnancy                             Anesthesia Physical Anesthesia Plan  ASA: III  Anesthesia Plan: Epidural   Post-op Pain Management:    Induction:   PONV Risk Score and Plan: Treatment may vary due to age or medical condition  Airway Management Planned: Natural Airway  Additional Equipment:   Intra-op Plan:   Post-operative Plan:   Informed Consent: I have reviewed the patients History and Physical, chart, labs and discussed the procedure including the risks, benefits and alternatives for the proposed anesthesia with the patient or authorized representative who has indicated his/her understanding and acceptance.       Plan Discussed with: Anesthesiologist  Anesthesia Plan Comments: (Patient identified. Risks, benefits, options discussed with patient including but not limited to bleeding, infection, nerve damage, paralysis, failed block, incomplete pain control, headache, blood pressure changes, nausea, vomiting,  reactions to medication, itching, and post partum back pain. Confirmed with bedside nurse the patient's most recent platelet count. Confirmed with the patient that they are not taking any anticoagulation, have any bleeding history or any family history of bleeding disorders. Patient expressed understanding and wishes to proceed. All questions were answered. )        Anesthesia Quick Evaluation
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
## 2019-06-02 NOTE — Progress Notes (Addendum)
Labor Progress Note Felicia Griffin is a 39 y.o. [id number] at [redacted]w[redacted]d presented for IOL for AMA  S: Patient reports increasing intensity of contractions that occur every 10-12 minutes.  O:  BP 116/72   Pulse 77   Temp 98.3 F (36.8 C) (Oral)   Resp 16   Ht 5\' 7"  (1.702 m)   Wt 82.2 kg   LMP 08/26/2018 (Within Days)   BMI 28.38 kg/m  EFM: FHR 130-140 / Variability:moderate/ Accelerations:present / Decelerations:Absent  CVE: Dilation: 1.5 Effacement (%): Thick Cervical Position: Posterior Station: Ballotable Presentation: Vertex Exam by:: Dr. [email or web address]    A&P: 39 y.o. 41 [redacted]w[redacted]d here for IOL at term for AMA. #Labor: Cervical dilation unchanged for 4 hours s/p Cytotec x1. Give second dose of Cytotec and reevaluate in 4 hours. Consider FB if appropriate. #Pain: Per patient request #FWB: Category 1 #GBS negative  [redacted]w[redacted]d Lonna Cobb, Medical Student 2:24 PM  OB FELLOW PROGRESS NOTE ATTESTATION  I have seen and examined this patient and agree with above documentation in the student's note.   Nurse, learning disability, D.O. OB Fellow  06/02/2019, 2:37 PM

## 2019-06-02 NOTE — Progress Notes (Signed)
Labor Progress Note Marilena Vanhecke is a 39 y.o. W9L2780 at [redacted]w[redacted]d presented for IOL for AMA  S: Patient resting comfortably  O:  BP 116/67   Pulse 79   Temp 98.3 F (36.8 C) (Oral)   Resp 18   Ht 5\' 7"  (1.702 m)   Wt 82.2 kg   LMP 08/26/2018 (Within Days)   BMI 28.38 kg/m  EFM: FHR 130-140 / Variability:moderate/ Accelerations:present / Decelerations:Absent  CVE: Dilation: 2 Effacement (%): Thick Cervical Position: Posterior Station: Ballotable Presentation: Vertex Exam by:: Dr. [email or web address]    A&P: 39 y.o. 39 [redacted]w[redacted]d here for IOL at term for AMA. #Labor: Cervical dilation improved in 4 hours. S/p cytotec x2, giving third dose now. Cervix soft, but thick, very anterior at this exam. Attempted to place [place], but could not due to very soft, very anterior cervix. Will attempt at next check. #Pain: Per patient request #FWB: Category 1 #GBS negative  Luceni, MD 6:33 PM

## 2019-06-02 NOTE — Progress Notes (Signed)
LABOR PROGRESS NOTE  Felicia Griffin is a 39 y.o. [id number] at [redacted]w[redacted]d  admitted for term/AMA IOL.  Subjective: Starting to feel contractions much more strongly  Objective: BP 127/72   Pulse 82   Temp 98.3 F (36.8 C) (Oral)   Resp 15   Ht 5\' 7"  (1.702 m)   Wt 82.2 kg   LMP 08/26/2018 (Within Days)   BMI 28.38 kg/m  or  Vitals:   06/02/19 1916 06/02/19 2045 06/02/19 2128 06/02/19 2250  BP: 119/66 129/61 121/63 127/72  Pulse: 83 80 72 82  Resp: 17 16 17 15   Temp:    98.3 F (36.8 C)  TempSrc:    Oral  Weight:      Height:         Dilation: 3.5 Effacement (%): 60 Cervical Position: Anterior Station: Ballotable Presentation: Vertex Exam by:: Bre Price RN FHT: baseline rate 135, moderate varibility, +acel, -decel Toco: q2 min  Labs: Lab Results  Component Value Date   WBC 10.0 06/02/2019   HGB 10.3 (L) 06/02/2019   HCT 30.2 (L) 06/02/2019   MCV 91.0 06/02/2019   PLT 223 06/02/2019    Patient Active Problem List   Diagnosis Date Noted  . Advanced maternal age in multigravida 06/02/2019  . Unwanted fertility 04/14/2019  . Grand multiparity 12/23/2018  . Abnormal Papanicolaou smear of cervix with positive human papilloma virus (HPV) test 12/23/2018  . AMA (advanced maternal age) multigravida 35+ 12/09/2018  . Supervision of high risk pregnancy, antepartum 11/25/2018    Assessment / Plan: 39 y.o. 11/27/2018 at [redacted]w[redacted]d here for term/AMA IOL.  Labor: progressing well s/p cyto x3, now w more favorable cervix, plan to start pitocin and AROM once feasible.  Fetal Wellbeing:  Cat I Pain Control:  Epidural upon request GBS: neg Anticipated MOD:  SVD   [id number], MD/MP[facility] OB Fellow  06/02/2019, 11:19 PM

## 2019-06-03 ENCOUNTER — Inpatient Hospital Stay (HOSPITAL_COMMUNITY): Payer: Medicaid Other | Admitting: Certified Registered Nurse Anesthetist

## 2019-06-03 ENCOUNTER — Encounter (HOSPITAL_COMMUNITY)
Admission: AD | Disposition: A | Discharge status: Home / Self Care | Payer: Self-pay | Source: Home / Self Care | Attending: Obstetrics & Gynecology

## 2019-06-03 ENCOUNTER — Encounter (HOSPITAL_COMMUNITY): Payer: Self-pay | Admitting: Obstetrics and Gynecology

## 2019-06-03 DIAGNOSIS — Z3A40 40 weeks gestation of pregnancy: Secondary | ICD-10-CM

## 2019-06-03 DIAGNOSIS — Z302 Encounter for sterilization: Secondary | ICD-10-CM

## 2019-06-03 HISTORY — PX: TUBAL LIGATION: SHX77

## 2019-06-03 LAB — RPR: RPR Ser Ql: NONREACTIVE

## 2019-06-03 SURGERY — LIGATION, FALLOPIAN TUBE, POSTPARTUM
Anesthesia: Choice | Laterality: Bilateral

## 2019-06-03 SURGERY — LIGATION, FALLOPIAN TUBE, POSTPARTUM
Anesthesia: Epidural

## 2019-06-03 MED ORDER — LACTATED RINGERS IV SOLN: INTRAVENOUS | Status: DC

## 2019-06-03 MED ORDER — LIDOCAINE-EPINEPHRINE (PF) 2 %-1:200000 IJ SOLN
INTRAMUSCULAR | Status: DC | PRN
  Administered 2019-06-03: 3 mL via EPIDURAL
  Administered 2019-06-03: 2 mL via EPIDURAL
  Administered 2019-06-03 (×3): 5 mL via EPIDURAL

## 2019-06-03 MED ORDER — ACETAMINOPHEN 325 MG PO TABS
650.0000 mg | ORAL_TABLET | ORAL | Status: DC | PRN
  Administered 2019-06-03 – 2019-06-04 (×2): 650 mg via ORAL
  Filled 2019-06-03 (×2): qty 2

## 2019-06-03 MED ORDER — ACETAMINOPHEN 10 MG/ML IV SOLN: 1000.0000 mg | Freq: Once | INTRAVENOUS | Status: DC | PRN

## 2019-06-03 MED ORDER — OXYCODONE HCL 5 MG/5ML PO SOLN: 5.0000 mg | Freq: Once | ORAL | Status: DC | PRN

## 2019-06-03 MED ORDER — MIDAZOLAM HCL 2 MG/2ML IJ SOLN
INTRAMUSCULAR | Status: AC
  Filled 2019-06-03: qty 2

## 2019-06-03 MED ORDER — PRENATAL MULTIVITAMIN CH
1.0000 | ORAL_TABLET | Freq: Every day | ORAL | Status: DC
  Administered 2019-06-03: 18:00:00 1 via ORAL
  Filled 2019-06-03 (×2): qty 1

## 2019-06-03 MED ORDER — DIPHENHYDRAMINE HCL 25 MG PO CAPS: 25.0000 mg | ORAL_CAPSULE | Freq: Four times a day (QID) | ORAL | Status: DC | PRN

## 2019-06-03 MED ORDER — MISOPROSTOL 200 MCG PO TABS
ORAL_TABLET | ORAL | Status: AC
  Filled 2019-06-03: qty 2

## 2019-06-03 MED ORDER — OXYCODONE HCL 5 MG PO TABS: 5.0000 mg | ORAL_TABLET | Freq: Once | ORAL | Status: DC | PRN

## 2019-06-03 MED ORDER — OXYCODONE HCL 5 MG PO TABS
5.0000 mg | ORAL_TABLET | ORAL | Status: DC | PRN
  Administered 2019-06-03: 5 mg via ORAL
  Filled 2019-06-03: qty 1

## 2019-06-03 MED ORDER — FENTANYL CITRATE (PF) 100 MCG/2ML IJ SOLN: INTRAMUSCULAR | Status: DC | PRN

## 2019-06-03 MED ORDER — BUPIVACAINE HCL (PF) 0.5 % IJ SOLN
INTRAMUSCULAR | Status: AC
  Filled 2019-06-03: qty 30

## 2019-06-03 MED ORDER — KETOROLAC TROMETHAMINE 30 MG/ML IJ SOLN
30.0000 mg | Freq: Once | INTRAMUSCULAR | Status: AC
  Administered 2019-06-03: 30 mg via INTRAVENOUS

## 2019-06-03 MED ORDER — MISOPROSTOL 200 MCG PO TABS
ORAL_TABLET | ORAL | Status: AC
  Filled 2019-06-03: qty 3

## 2019-06-03 MED ORDER — FENTANYL CITRATE (PF) 100 MCG/2ML IJ SOLN
INTRAMUSCULAR | Status: DC | PRN
  Administered 2019-06-03: 100 ug via EPIDURAL

## 2019-06-03 MED ORDER — ONDANSETRON HCL 4 MG/2ML IJ SOLN: 4.0000 mg | INTRAMUSCULAR | Status: DC | PRN

## 2019-06-03 MED ORDER — KETOROLAC TROMETHAMINE 30 MG/ML IJ SOLN
INTRAMUSCULAR | Status: AC
  Filled 2019-06-03: qty 1

## 2019-06-03 MED ORDER — LIDOCAINE-EPINEPHRINE (PF) 2 %-1:200000 IJ SOLN
INTRAMUSCULAR | Status: AC
  Filled 2019-06-03: qty 10

## 2019-06-03 MED ORDER — FENTANYL CITRATE (PF) 100 MCG/2ML IJ SOLN
INTRAMUSCULAR | Status: AC
  Filled 2019-06-03: qty 2

## 2019-06-03 MED ORDER — ZOLPIDEM TARTRATE 5 MG PO TABS: 5.0000 mg | ORAL_TABLET | Freq: Every evening | ORAL | Status: DC | PRN

## 2019-06-03 MED ORDER — HYDROMORPHONE HCL 1 MG/ML IJ SOLN: 0.2500 mg | INTRAMUSCULAR | Status: DC | PRN

## 2019-06-03 MED ORDER — MISOPROSTOL 200 MCG PO TABS
600.0000 ug | ORAL_TABLET | Freq: Once | ORAL | Status: AC
  Administered 2019-06-03: 600 ug via RECTAL

## 2019-06-03 MED ORDER — MISOPROSTOL 200 MCG PO TABS
400.0000 ug | ORAL_TABLET | Freq: Once | ORAL | Status: AC
  Administered 2019-06-03: 400 ug via BUCCAL

## 2019-06-03 MED ORDER — COCONUT OIL OIL
1.0000 "application " | TOPICAL_OIL | Status: DC | PRN
  Administered 2019-06-03: 1 via TOPICAL

## 2019-06-03 MED ORDER — METHYLERGONOVINE MALEATE 0.2 MG/ML IJ SOLN
0.2000 mg | Freq: Once | INTRAMUSCULAR | Status: AC
  Administered 2019-06-03: 0.2 mg via INTRAMUSCULAR

## 2019-06-03 MED ORDER — IBUPROFEN 600 MG PO TABS
600.0000 mg | ORAL_TABLET | Freq: Four times a day (QID) | ORAL | Status: DC
  Administered 2019-06-03 – 2019-06-04 (×5): 600 mg via ORAL
  Filled 2019-06-03 (×5): qty 1

## 2019-06-03 MED ORDER — SENNOSIDES-DOCUSATE SODIUM 8.6-50 MG PO TABS: 2.0000 | ORAL_TABLET | Freq: Every evening | ORAL | Status: DC | PRN

## 2019-06-03 MED ORDER — PROMETHAZINE HCL 25 MG/ML IJ SOLN: 6.2500 mg | INTRAMUSCULAR | Status: DC | PRN

## 2019-06-03 MED ORDER — TETANUS-DIPHTH-ACELL PERTUSSIS 5-2.5-18.5 LF-MCG/0.5 IM SUSP: 0.5000 mL | Freq: Once | INTRAMUSCULAR | Status: DC

## 2019-06-03 MED ORDER — FAMOTIDINE 20 MG PO TABS
40.0000 mg | ORAL_TABLET | Freq: Once | ORAL | Status: AC
  Administered 2019-06-03: 40 mg via ORAL
  Filled 2019-06-03: qty 2

## 2019-06-03 MED ORDER — METOCLOPRAMIDE HCL 10 MG PO TABS
10.0000 mg | ORAL_TABLET | Freq: Once | ORAL | Status: AC
  Administered 2019-06-03: 10 mg via ORAL
  Filled 2019-06-03: qty 1

## 2019-06-03 MED ORDER — WITCH HAZEL-GLYCERIN EX PADS: 1.0000 "application " | MEDICATED_PAD | CUTANEOUS | Status: DC | PRN

## 2019-06-03 MED ORDER — ONDANSETRON HCL 4 MG PO TABS: 4.0000 mg | ORAL_TABLET | ORAL | Status: DC | PRN

## 2019-06-03 MED ORDER — SIMETHICONE 80 MG PO CHEW: 80.0000 mg | CHEWABLE_TABLET | ORAL | Status: DC | PRN

## 2019-06-03 MED ORDER — BUPIVACAINE HCL 0.5 % IJ SOLN
INTRAMUSCULAR | Status: DC | PRN
  Administered 2019-06-03: 20 mL
  Administered 2019-06-03: 10 mL

## 2019-06-03 MED ORDER — DIBUCAINE (PERIANAL) 1 % EX OINT: 1.0000 "application " | TOPICAL_OINTMENT | CUTANEOUS | Status: DC | PRN

## 2019-06-03 MED ORDER — METHYLERGONOVINE MALEATE 0.2 MG/ML IJ SOLN
INTRAMUSCULAR | Status: AC
  Filled 2019-06-03: qty 1

## 2019-06-03 MED ORDER — BENZOCAINE-MENTHOL 20-0.5 % EX AERO
1.0000 "application " | INHALATION_SPRAY | CUTANEOUS | Status: DC | PRN
  Administered 2019-06-03: 1 via TOPICAL
  Filled 2019-06-03: qty 56

## 2019-06-03 MED ORDER — SENNOSIDES-DOCUSATE SODIUM 8.6-50 MG PO TABS: 2.0000 | ORAL_TABLET | ORAL | Status: DC

## 2019-06-03 SURGICAL SUPPLY — 28 items
CLIP FILSHIE TUBAL LIGA STRL (Clip) ×3 IMPLANT
CLOTH BEACON ORANGE TIMEOUT ST (SAFETY) ×3 IMPLANT
DERMABOND ADVANCED (GAUZE/BANDAGES/DRESSINGS) ×2
DERMABOND ADVANCED .7 DNX12 (GAUZE/BANDAGES/DRESSINGS) ×1 IMPLANT
DRSG OPSITE POSTOP 3X4 (GAUZE/BANDAGES/DRESSINGS) ×3 IMPLANT
DURAPREP 26ML APPLICATOR (WOUND CARE) ×3 IMPLANT
ELECT REM PT RETURN 9FT ADLT (ELECTROSURGICAL) ×3
ELECTRODE REM PT RTRN 9FT ADLT (ELECTROSURGICAL) ×1 IMPLANT
GLOVE BIO SURGEON STRL SZ7 (GLOVE) ×3 IMPLANT
GLOVE BIOGEL PI IND STRL 7.0 (GLOVE) ×1 IMPLANT
GLOVE BIOGEL PI IND STRL 7.5 (GLOVE) ×1 IMPLANT
GLOVE BIOGEL PI INDICATOR 7.0 (GLOVE) ×2
GLOVE BIOGEL PI INDICATOR 7.5 (GLOVE) ×2
GOWN STRL REUS W/TWL LRG LVL3 (GOWN DISPOSABLE) ×6 IMPLANT
NEEDLE HYPO 22GX1.5 SAFETY (NEEDLE) ×3 IMPLANT
NS IRRIG 1000ML POUR BTL (IV SOLUTION) ×3 IMPLANT
PACK ABDOMINAL MINOR (CUSTOM PROCEDURE TRAY) ×3 IMPLANT
PENCIL BUTTON HOLSTER BLD 10FT (ELECTRODE) IMPLANT
PROTECTOR NERVE ULNAR (MISCELLANEOUS) ×3 IMPLANT
SPONGE LAP 4X18 RFD (DISPOSABLE) ×3 IMPLANT
SUT MNCRL AB 4-0 PS2 18 (SUTURE) ×3 IMPLANT
SUT PLAIN 2 0 (SUTURE)
SUT PLAIN ABS 2-0 CT1 27XMFL (SUTURE) IMPLANT
SUT VICRYL 0 TIES 12 18 (SUTURE) IMPLANT
SUT VICRYL 0 UR6 27IN ABS (SUTURE) ×3 IMPLANT
SYR CONTROL 10ML LL (SYRINGE) ×3 IMPLANT
TOWEL OR 17X24 6PK STRL BLUE (TOWEL DISPOSABLE) ×6 IMPLANT
TRAY FOLEY CATH SILVER 14FR (SET/KITS/TRAYS/PACK) ×3 IMPLANT

## 2019-06-03 NOTE — Anesthesia Postprocedure Evaluation (Signed)
Anesthesia Post Note  Patient: Felicia Griffin  Procedure(s) Performed: POST PARTUM TUBAL LIGATION (N/A )     Patient location during evaluation: PACU Anesthesia Type: Epidural Level of consciousness: awake Pain management: pain level controlled Vital Signs Assessment: post-procedure vital signs reviewed and stable Respiratory status: spontaneous breathing, respiratory function stable and patient connected to nasal cannula oxygen Cardiovascular status: blood pressure returned to baseline and stable Postop Assessment: no headache, no backache, no apparent nausea or vomiting and epidural receding Anesthetic complications: no    Last Vitals:  Vitals:   06/03/19 1532 06/03/19 1545  BP: 103/63 104/64  Pulse: 71 65  Resp: 13 12  Temp: 36.4 C   SpO2: 100% 98%    Last Pain:  Vitals:   06/03/19 1532  TempSrc: Axillary  PainSc:    Pain Goal: Patients Stated Pain Goal: 9 (06/02/19 1655)                 [name] P Yareth Macdonnell
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
## 2019-06-03 NOTE — Progress Notes (Signed)
OB Note D/w her re: BTL and r/b/a and she desires BTL. Papers are UTD. She delivered at 0200 today and last had PO at 0700 but threw it up (applesauce). Will touch base with OR to see about timing but I told her that she may need to wait full 8 hours before doing procedure. Pt told to stay NPO until after BTL.  Cornelia Copa MD Attending Center for [employer] (Faculty Practice) 06/03/2019 Time: [phone]

## 2019-06-03 NOTE — Discharge Summary (Signed)
Postpartum Discharge Summary     Patient Name: Felicia Griffin DOB: [birth year] MRN: [id number]  Date of admission: 06/02/2019 Delivering Provider: Clarnce Flock   Date of discharge: 06/04/2019  Admitting diagnosis: Advanced maternal age in multigravida [O09.529] Intrauterine pregnancy: [redacted]w[redacted]d    Secondary diagnosis:  Active Problems:   Supervision of high risk pregnancy, antepartum   AMA (advanced maternal age) multigravida 35+   Grand multiparity   Abnormal Papanicolaou smear of cervix with positive human papilloma virus (HPV) test   Unwanted fertility   Advanced maternal age in multigravida   Vacuum-assisted vaginal delivery  Additional problems: None     Discharge diagnosis: Term Pregnancy Delivered                                                                                                Post partum procedures:postpartum tubal ligation with Filshie clips  Augmentation: AROM, Pitocin and Cytotec  Complications: None  Hospital course:  Induction of Labor With Vaginal Delivery   39y.o. yo [id number] 425w1das admitted to the hospital 06/02/2019 for induction of labor.  Indication for induction: Postdates and AMA.  Patient had an uncomplicated labor course as follows: Patient arrived at 1 cm dilation and was induced with misoprostol x3, at which point her cervix was 3.5cm and she was started on pitocin. She progressed rapidly to fully dilated and then had AROM for clear. There was subsequently a prolonged deceleration into the 60's with intermittent recovery and patient was consented for a VAVD.  Membrane Rupture Time/Date: 1:53 AM ,06/03/2019   Intrapartum Procedures: Episiotomy: None [1]                                         Lacerations:  None [1]  Patient had delivery of a Viable infant.  Information for the patient's newborn:  RoNaquita, Nappier0[[id number]]Delivery Method: Vag-Spont    06/03/2019  Details of delivery can be found in separate delivery note.  Patient had BTL with filshie clips done on PPD#0. Desired DC on PPD#1. Patient had a routine postpartum course. Patient is discharged home 06/04/19. Delivery time: 2:08 AM    Magnesium Sulfate received: No BMZ received: No Rhophylac:N/A MMR:N/A Transfusion:No  Physical exam  Vitals:   06/03/19 1720 06/03/19 2107 06/04/19 0113 06/04/19 0504  BP: 118/83 118/64 112/71 114/63  Pulse: 73 74 69 72  Resp: '16 16 20 18  '$ Temp: 98.8 F (37.1 C) 98.3 F (36.8 C) 98.4 F (36.9 C) 97.6 F (36.4 C)  TempSrc: Oral  Oral Oral  SpO2: 100%  95% 100%  Weight:      Height:       General: alert, cooperative and no distress Lochia: appropriate Uterine Fundus: firm Incision: Healing well with no significant drainage DVT Evaluation: No evidence of DVT seen on physical exam. Labs: Lab Results  Component Value Date   WBC 10.0 06/02/2019   HGB 10.3 (L) 06/02/2019   HCT 30.2 (L) 06/02/2019   MCV 91.0 06/02/2019  PLT 223 06/02/2019   No flowsheet data found.  Discharge instruction: per After Visit Summary and "Baby and Me Booklet".  After visit meds:  Allergies as of 06/04/2019   No Known Allergies     Medication List    STOP taking these medications   metroNIDAZOLE 500 MG tablet Commonly known as: FLAGYL     TAKE these medications   acetaminophen 325 MG tablet Commonly known as: Tylenol Take 2 tablets (650 mg total) by mouth every 6 (six) hours as needed (for pain scale < 4). What changed:   medication strength  how much to take  reasons to take this   Blood Pressure Kit Monitor BP readings at home regularly O09.90 Large   Blood Pressure Monitor Kit 1 Device by Does not apply route once a week. To be monitored Regularly at home.   ibuprofen 600 MG tablet Commonly known as: ADVIL Take 1 tablet (600 mg total) by mouth every 6 (six) hours.   oxyCODONE 5 MG immediate release tablet Commonly known as: Oxy IR/ROXICODONE Take 1 tablet (5 mg total) by mouth every 4 (four)  hours as needed for severe pain.   Vitafol Ultra 29-0.6-0.4-200 MG Caps Take 1 capsule by mouth daily.       Diet: routine diet  Activity: Advance as tolerated. Pelvic rest for 6 weeks.   Outpatient follow up:6 weeks Follow up Appt: Future Appointments  Date Time Provider [place]  07/15/2019  1:30 PM Constant, Vickii Chafe, MD [place] None   Follow up Visit:  Please schedule this patient for Postpartum visit in: 6 weeks with the following provider: Any provider  Virtual  For C/S patients schedule nurse incision check in weeks 2 weeks: no  High risk pregnancy complicated by: AMA, grand multip  Delivery mode: Vacuum  Anticipated Birth Control: BTL done PP  PP Procedures needed: Incision check  Schedule Integrated BH visit: no     Newborn Data: Live born female  Birth Weight:  3515g APGAR: 66, 9  Newborn Delivery   Birth date/time: 06/03/2019 02:08:00 Delivery type: Vaginal, Spontaneous      Baby Feeding: Bottle and Breast Disposition:home with mother   06/04/2019 Chauncey Mann, MD

## 2019-06-03 NOTE — Lactation Note (Signed)
This note was copied from a baby's chart. Lactation Consultation Note  Patient Name: Felicia Griffin [id number] Date: 06/03/2019 Reason for consult: Initial assessment;Term  P9 mother whose infant is now 3 hours old.  Mother breast fed all her other children for varying lengths of time.  The ages range from 80 years-41 years old.  The 41 year old and 41 year old were breast fed for 2 years each.  Baby was swaddled and sleeping in the bassinet when I arrived.  Mother had no questions/concerns related to breast feeding.  She feels like baby has latched well since delivery and she feels uterine contractions during breast feeding.  Offered to practice hand expression but mother declined.  Colostrum container provided and milk storage times reviewed.  Finger feeding demonstrated.    Encouraged to feed 8-12 times/24 hours or sooner if baby shows feeding cues.  Mother was familiar with cues.  She will call for assistance as needed.  Mom made aware of O/P services, breastfeeding support groups, community resources, and our phone # for post-discharge questions.  Mother does not have a DEBP for home use, however, she is a [facility] participant in [place].  She is planning on contacting the [facility] office tomorrow to obtain a DEBP.  Her older daughter was present.   Maternal Data Formula Feeding for Exclusion: No Has patient been taught Hand Expression?: Yes(Did not wish to review) Does the patient have breastfeeding experience prior to this delivery?: Yes  Feeding Feeding Type: Bottle Fed - Formula Nipple Type: Slow - flow  LATCH Score                   Interventions    Lactation Tools Discussed/Used WIC Program: Yes   Consult Status Consult Status: Follow-up Date: 06/04/19 Follow-up type: In-patient    Dora Sims 06/03/2019, 6:25 PM

## 2019-06-03 NOTE — Anesthesia Postprocedure Evaluation (Signed)
Anesthesia Post Note  Patient: Felicia Griffin  Procedure(s) Performed: AN AD HOC LABOR EPIDURAL     Patient location during evaluation: Mother Baby Anesthesia Type: Epidural Level of consciousness: awake and alert Pain management: pain level controlled Vital Signs Assessment: post-procedure vital signs reviewed and stable Respiratory status: spontaneous breathing, nonlabored ventilation and respiratory function stable Cardiovascular status: stable Postop Assessment: no headache, no backache and epidural receding Anesthetic complications: no    Last Vitals:  Vitals:   06/03/19 0445 06/03/19 0559  BP: 111/78 (!) 116/58  Pulse: 75 71  Resp: 16 16  Temp: 36.8 C 36.9 C  SpO2: 100% 100%    Last Pain:  Vitals:   06/03/19 0600  TempSrc:   PainSc: 3    Pain Goal: Patients Stated Pain Goal: 9 (06/02/19 1655)                 [name]

## 2019-06-03 NOTE — Transfer of Care (Signed)
Immediate Anesthesia Transfer of Care Note  Patient: Felicia Griffin  Procedure(s) Performed: POST PARTUM TUBAL LIGATION (N/A )  Patient Location: PACU  Anesthesia Type:Epidural  Level of Consciousness: awake, alert  and oriented  Airway & Oxygen Therapy: Patient Spontanous Breathing  Post-op Assessment: Report given to RN and Post -op Vital signs reviewed and stable  Post vital signs: Reviewed and stable  Last Vitals:  Vitals Value Taken Time  BP 96/53 06/03/19 1456  Temp    Pulse 73 06/03/19 1458  Resp 9 06/03/19 1458  SpO2 97 % 06/03/19 1458  Vitals shown include unvalidated device data.  Last Pain:  Vitals:   06/03/19 1100  TempSrc:   PainSc: 3       Patients Stated Pain Goal: 9 (06/02/19 1655)  Complications: No apparent anesthesia complications

## 2019-06-03 NOTE — Anesthesia Preprocedure Evaluation (Signed)
Anesthesia Evaluation    Reviewed: Allergy & Precautions, Patient's Chart, lab work & pertinent test results  Airway Mallampati: I  TM Distance: >3 FB Neck ROM: Full    Dental no notable dental hx.    Pulmonary former smoker,    Pulmonary exam normal breath sounds clear to auscultation       Cardiovascular negative cardio ROS Normal cardiovascular exam Rhythm:Regular Rate:Normal     Neuro/Psych negative neurological ROS     GI/Hepatic negative GI ROS, Neg liver ROS,   Endo/Other  negative endocrine ROS  Renal/GU negative Renal ROS     Musculoskeletal negative musculoskeletal ROS (+)   Abdominal   Peds  Hematology  (+) anemia ,   Anesthesia Other Findings desires sterilization  Reproductive/Obstetrics                             Anesthesia Physical Anesthesia Plan  ASA: II  Anesthesia Plan: Epidural   Post-op Pain Management:    Induction: Intravenous  PONV Risk Score and Plan: 2 and Ondansetron, Dexamethasone and Treatment may vary due to age or medical condition  Airway Management Planned: Natural Airway  Additional Equipment:   Intra-op Plan:   Post-operative Plan: Extubation in OR  Informed Consent:     Dental advisory given  Plan Discussed with: CRNA  Anesthesia Plan Comments:         Anesthesia Quick Evaluation

## 2019-06-03 NOTE — Lactation Note (Signed)
This note was copied from a baby's chart. Lactation Consultation Note LC attempted consult. Mom sleeping soundly.  Patient Name: Felicia Griffin [id number] Date: 06/03/2019     Maternal Data    Feeding Feeding Type: Breast Fed  LATCH Score Latch: Grasps breast easily, tongue down, lips flanged, rhythmical sucking.  Audible Swallowing: Spontaneous and intermittent  Type of Nipple: Everted at rest and after stimulation  Comfort (Breast/Nipple): Soft / non-tender  Hold (Positioning): No assistance needed to correctly position infant at breast.  LATCH Score: 10  Interventions    Lactation Tools Discussed/Used     Consult Status      Charyl Dancer 06/03/2019, 5:43 AM

## 2019-06-03 NOTE — Op Note (Signed)
Operative Note   06/03/2019  PRE-OP DIAGNOSIS: Desire for permanent sterilization.  Postpartum Day #0   POST-OP DIAGNOSIS: Same  SURGEON: Surgeon(s) and Role:    * Melvin Bing, MD - Primary  ASSISTANT: None  ANESTHESIA: epidural and local  PROCEDURE: mini-laparotomy, bilateral tubal ligation via Filshie Clips method  ESTIMATED BLOOD LOSS: 49mL  DRAINS: None  TOTAL IV FLUIDS: per anesthesia note  SPECIMENS:  None  VTE PROPHYLAXIS: SCDs to the bilateral lower extremities  ANTIBIOTICS: not indicated  COMPLICATIONS: none  DISPOSITION: PACU - hemodynamically stable.  CONDITION: stable  FINDINGS: No intra-abdominal adhesions noted. Smooth, normally contoured uterine fundus and bilateral tubes. Normal appearing tubes and normal ovaries on palpation. PROCEDURE IN DETAIL: The patient was taken to the OR where anesthesia was administed. The patient was positioned in dorsal supine. The patient was prepped and draped in the normal sterile fashion a 3-4cm horizontal incision was made at the infraumbilical fold, after injection with local anesthesia. The skin was then incised with the scalpel and the underlying tissue dissected with the bovie and the fascia nicked in the midline with the scalpel and then extended laterally sharply.  The abdomen was then entered bluntly and a moist lap sponge used to displace the bowel.  The left Fallopian tube was identified by tracing out to the fimbraie, grasped with the Babcock clamps. An avascular midsection of the tube approximately 3-4cm from the cornua was grasped with the babcock clamps and the filshie clip was applied, taking care to incorporate the entire tube.  Attention was then turned to the right fallopian tube after confirmation by tracing the tube out to the fimbriae. The same procedure was then performed on the right Fallopian tube, with excellent hemostasis was noted from both BTL sites.   The lap sponge was then removed from the  abdomen and the fascia closed in running fashion with 0 vicryl and the skin was then closed with 4-0 vicryl  The patient tolerated the procedure well. All counts were correct x 2. The patient was transferred to the recovery room awake, alert and breathing independently.   Cornelia Copa MD Attending Center for [employer] [facility])

## 2019-06-04 ENCOUNTER — Encounter: Payer: Self-pay | Admitting: *Deleted

## 2019-06-04 LAB — TYPE AND SCREEN
ABO/RH(D): O POS
Antibody Screen: POSITIVE
Donor AG Type: NEGATIVE
Donor AG Type: NEGATIVE
Unit division: 0
Unit division: 0

## 2019-06-04 LAB — BPAM RBC
Blood Product Expiration Date: 202102262359
Blood Product Expiration Date: 202102262359
Unit Type and Rh: 5100
Unit Type and Rh: 5100

## 2019-06-04 MED ORDER — ACETAMINOPHEN 325 MG PO TABS: 650.0000 mg | ORAL_TABLET | Freq: Four times a day (QID) | ORAL | 0 refills | Status: DC | PRN

## 2019-06-04 MED ORDER — OXYCODONE HCL 5 MG PO TABS: 5.0000 mg | ORAL_TABLET | ORAL | 0 refills | Status: DC | PRN

## 2019-06-04 MED ORDER — IBUPROFEN 600 MG PO TABS: 600.0000 mg | ORAL_TABLET | Freq: Four times a day (QID) | ORAL | 0 refills | Status: DC
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
## 2019-06-04 NOTE — Lactation Note (Addendum)
This note was copied from a baby's chart. Lactation Consultation Note: Infant is 59 hours old. Infant has been weighted twice this am . She was 13.5 then 13.7 % wt loss. There is no picture of delivery weight. Unsure of accuracy,  Mother is concerned and reports that she plans to formula feed while she is in the hospital. She reports that she doesn't hear infant swallow when at the breast.  Mother just gave infant formula before I arrived. Infant took 50 ml within the last hour.  Discussed hand expression and mother reports that she sees drops when he expresses.   Discussed importance of protecting her milk supply. Mother reports that she plans to get a pump from [facility]. Informed mother that a DEBP could be sat up for her while she is here and she could pump every 2-3 hours for 15 mins.  Mother exclaims that she would like to do that.   A DEBP was sat up for mother.  #27 flange were placed on pump at her request. She reports that she last used the #27. Mother to page when ready to pump.  She was also given a harmony hand pump with instructions to use as needed at home before she gets a pump from [facility]. Mother was receptive to all teaching.  Mother advised to breastfeed infant , supplement infant with ebm/formula and then pump.     Patient Name: Girl Carolann Brazell [id number] Date: 06/04/2019 Reason for consult: Follow-up assessment   Maternal Data    Feeding Feeding Type: Formula  LATCH Score                   Interventions Interventions: Hand express(br massage)  Lactation Tools Discussed/Used WIC Program: Yes Pump Review: Setup, frequency, and cleaning;Milk Storage Initiated by:: Stevan Born RN,IBCLC Date initiated:: 06/04/19   Consult Status Consult Status: Follow-up Date: 06/05/19 Follow-up type: In-patient    Stevan Born [facility] 06/04/2019, 10:28 AM

## 2019-06-04 NOTE — Lactation Note (Signed)
This note was copied from a baby's chart. Lactation Consultation Note: LC returned back to room to assist mother with pumping.  Mother was attempting to latch infant on in cradle hold.   Assist mother with hand expression and observed sprays of ebm . Mother excited to see milk.   Offered assistance with positioning infant and mother agreeable.  Mother taught to do cross cradle hold. Encouraged mother to use pillow support . Infant latched on with pursed lips.  Mother was taught to adjust infants lower jaw for wider gape and flip top lip upward. Infants lips well flanged . Mother reports that this latch doesn't hurt and she now knows that infant was not latched well.  Observed infant with strong suckling and swallows. Mother taught to do breast compression and audible swallows were present. Infant fed for 15 mins and was still feeding when LC left the room.  Suggested that mother page to check flange size when she gets ready to pump.       Patient Name: Girl Ellana Kawa [id number] Date: 06/04/2019 Reason for consult: Follow-up assessment   Maternal Data    Feeding Feeding Type: Breast Fed  LATCH Score Latch: Grasps breast easily, tongue down, lips flanged, rhythmical sucking.  Audible Swallowing: Spontaneous and intermittent  Type of Nipple: Everted at rest and after stimulation  Comfort (Breast/Nipple): Filling, red/small blisters or bruises, mild/mod discomfort(br are non-tender but filling)  Hold (Positioning): Assistance needed to correctly position infant at breast and maintain latch.  LATCH Score: 8  Interventions Interventions: Assisted with latch;Skin to skin;Hand express;Breast compression;Adjust position;Support pillows;Position options;Hand pump;DEBP  Lactation Tools Discussed/Used WIC Program: Yes Pump Review: Setup, frequency, and cleaning;Milk Storage Initiated by:: Stevan Born RN,IBCLC Date initiated:: 06/04/19   Consult Status Consult Status:  Follow-up Date: 06/05/19 Follow-up type: In-patient    Stevan Born [facility] 06/04/2019, 12:24 PM

## 2019-06-04 NOTE — Progress Notes (Signed)
Patient told to take dressing off in 2 to 3 days. DC teaching done.

## 2019-06-05 ENCOUNTER — Ambulatory Visit: Payer: Self-pay

## 2019-06-05 NOTE — Lactation Note (Signed)
This note was copied from a baby's chart. Lactation Consultation Note  Patient Name: Felicia Griffin [id number] Date: 06/05/2019 Reason for consult: Follow-up assessment;Infant weight loss  57 hours old FT female who is being partially BF and formula fed by her mother, she's a P9 and experienced BF. Baby is a baby patient and she's going home today, weight loss stands at 14% but most likely due to an error when recording birth weight, checked with RN Michelle Nasuti and she confirmed those findings.  Mom holding baby when entering the room, she was thankful for all the help her previous LC provided, mom voiced to [facility] that she's not getting the "hang" on BF and reports that baby most likely won't be getting formula after she's done with the bottles she got at the hospital, she's BF all her kids before.  Reviewed engorgement prevention and treatment, prevention/treatment for sore nipples and discharge instructions. Mom didn't have a support person in the room at the time of [facility] consultation, she reported all questions and concerns were answered, she's aware of LC OP services and will call PRN.   Maternal Data    Feeding    LATCH Score                   Interventions Interventions: Breast feeding basics reviewed  Lactation Tools Discussed/Used     Consult Status Consult Status: Complete Date: 06/05/19 Follow-up type: Call as needed    Luisfelipe Engelstad Venetia Constable 06/05/2019, 11:50 AM

## 2019-07-15 ENCOUNTER — Encounter: Payer: Self-pay | Admitting: Obstetrics and Gynecology

## 2019-07-15 ENCOUNTER — Telehealth (INDEPENDENT_AMBULATORY_CARE_PROVIDER_SITE_OTHER): Payer: Medicaid Other | Admitting: Obstetrics and Gynecology

## 2019-07-15 NOTE — Progress Notes (Signed)
I connected with [name] on 07/15/19 at  1:30 PM EST by: MyChart Video encounter and verified that I am speaking with the correct person using two identifiers.  Patient is located at home and provider is located at [facility].     The purpose of this virtual visit is to provide medical care while limiting exposure to the novel coronavirus. I discussed the limitations, risks, security and privacy concerns of performing an evaluation and management service by MyChart Video encounter and the availability of in person appointments. I also discussed with the patient that there may be a patient responsible charge related to this service. By engaging in this virtual visit, you consent to the provision of healthcare.  Additionally, you authorize for your insurance to be billed for the services provided during this visit.  The patient expressed understanding and agreed to proceed.   Post Partum Visit Note Subjective:    Felicia Griffin is a 39 y.o. [phone] female who presents for a postpartum visit. She is 1.5 months postpartum following a spontaneous vaginal delivery. I have fully reviewed the prenatal and intrapartum course. The delivery was at [redacted]w[redacted]d gestational weeks. Outcome: spontaneous vaginal delivery. Anesthesia: epidural. Postpartum course has been unremarkable. Baby's course has been unremarkable. Baby is feeding by both breast and bottle - [employer] Good Start Soy and [employer] Pro[employer]otiocs . Bleeding no bleeding. Bowel function is normal. Bladder function is normal. Patient is not sexually active. Contraception method is tubal ligation. Postpartum depression screening: negative.  EPD= 3     Review of Systems Pertinent items noted in HPI and remainder of comprehensive ROS otherwise negative.   Objective:  Physical Exam:  LMP 08/26/2018 (Within Days)  Patient unable to take BP at time of visit General:  Alert, oriented and cooperative. Patient is in no acute distress.  Mental  Status: Normal mood and affect. Normal behavior. Normal judgment and thought content.   Respiratory: Normal respiratory effort noted, no problems with respiration noted  Rest of physical exam deferred due to type of encounter  PP Depression Screening:   Edinburgh Postnatal Depression Scale Screening Tool 07/15/2019 06/04/2019 06/04/2019 06/03/2019 06/03/2019  I have been able to laugh and see the funny side of things. 0 0 (No Data) (No Data) (No Data)  I have looked forward with enjoyment to things. 0 0 - - -  I have blamed myself unnecessarily when things went wrong. 0 1 - - -  I have been anxious or worried for no good reason. 2 0 - - -  I have felt scared or panicky for no good reason. 0 0 - - -  Things have been getting on top of me. 0 0 - - -  I have been so unhappy that I have had difficulty sleeping. 0 0 - - -  I have felt sad or miserable. 0 0 - - -  I have been so unhappy that I have been crying. 1 0 - - -  The thought of harming myself has occurred to me. 0 0 - - -  Edinburgh Postnatal Depression Scale Total 3 1 - - -           Assessment:    Normal postpartum exam. Pap smear not done at today's visit. Last pap smear 12/2018 and results were normal.   Plan:    1. Contraception: tubal ligation 2. Patient is medically cleared to returned to all regular activites 3. Follow up in: 6 months or as needed.  11 minutes of non-face-to-face time spent with the patient   Zeus Marquis, MD 07/15/2019 1:36 PM

## 2019-08-05 DIAGNOSIS — H52223 Regular astigmatism, bilateral: Secondary | ICD-10-CM | POA: Diagnosis not present

## 2019-08-05 DIAGNOSIS — H5213 Myopia, bilateral: Secondary | ICD-10-CM | POA: Diagnosis not present

## 2019-08-05 DIAGNOSIS — H524 Presbyopia: Secondary | ICD-10-CM | POA: Diagnosis not present

## 2019-08-09 DIAGNOSIS — H5213 Myopia, bilateral: Secondary | ICD-10-CM | POA: Diagnosis not present

## 2019-09-30 DIAGNOSIS — H524 Presbyopia: Secondary | ICD-10-CM | POA: Diagnosis not present

## 2020-01-26 ENCOUNTER — Ambulatory Visit (HOSPITAL_COMMUNITY): Payer: Medicaid Other | Admitting: Licensed Clinical Social Worker

## 2020-04-17 ENCOUNTER — Encounter: Payer: Self-pay | Admitting: General Practice

## 2020-04-19 DIAGNOSIS — Z130 Encounter for screening for diseases of the blood and blood-forming organs and certain disorders involving the immune mechanism: Secondary | ICD-10-CM | POA: Diagnosis not present

## 2020-04-19 DIAGNOSIS — Z13220 Encounter for screening for lipoid disorders: Secondary | ICD-10-CM | POA: Diagnosis not present

## 2020-04-19 DIAGNOSIS — Z131 Encounter for screening for diabetes mellitus: Secondary | ICD-10-CM | POA: Diagnosis not present

## 2020-04-19 DIAGNOSIS — F419 Anxiety disorder, unspecified: Secondary | ICD-10-CM | POA: Diagnosis not present

## 2020-04-19 DIAGNOSIS — Z1329 Encounter for screening for other suspected endocrine disorder: Secondary | ICD-10-CM | POA: Diagnosis not present

## 2020-04-20 DIAGNOSIS — Z130 Encounter for screening for diseases of the blood and blood-forming organs and certain disorders involving the immune mechanism: Secondary | ICD-10-CM | POA: Diagnosis not present

## 2020-04-20 DIAGNOSIS — F419 Anxiety disorder, unspecified: Secondary | ICD-10-CM | POA: Diagnosis not present

## 2020-04-20 DIAGNOSIS — Z131 Encounter for screening for diabetes mellitus: Secondary | ICD-10-CM | POA: Diagnosis not present

## 2020-04-20 DIAGNOSIS — Z13220 Encounter for screening for lipoid disorders: Secondary | ICD-10-CM | POA: Diagnosis not present

## 2020-04-20 DIAGNOSIS — Z1329 Encounter for screening for other suspected endocrine disorder: Secondary | ICD-10-CM | POA: Diagnosis not present

## 2020-07-28 DIAGNOSIS — F419 Anxiety disorder, unspecified: Secondary | ICD-10-CM | POA: Diagnosis not present

## 2020-07-28 DIAGNOSIS — B373 Candidiasis of vulva and vagina: Secondary | ICD-10-CM | POA: Diagnosis not present

## 2020-07-28 DIAGNOSIS — Z113 Encounter for screening for infections with a predominantly sexual mode of transmission: Secondary | ICD-10-CM | POA: Diagnosis not present

## 2020-07-28 DIAGNOSIS — Z01419 Encounter for gynecological examination (general) (routine) without abnormal findings: Secondary | ICD-10-CM | POA: Diagnosis not present

## 2020-07-28 DIAGNOSIS — Z01411 Encounter for gynecological examination (general) (routine) with abnormal findings: Secondary | ICD-10-CM | POA: Diagnosis not present

## 2020-11-10 DIAGNOSIS — Z130 Encounter for screening for diseases of the blood and blood-forming organs and certain disorders involving the immune mechanism: Secondary | ICD-10-CM | POA: Diagnosis not present

## 2020-11-10 DIAGNOSIS — F419 Anxiety disorder, unspecified: Secondary | ICD-10-CM | POA: Diagnosis not present

## 2020-11-10 DIAGNOSIS — D649 Anemia, unspecified: Secondary | ICD-10-CM | POA: Diagnosis not present

## 2020-11-10 DIAGNOSIS — Z1329 Encounter for screening for other suspected endocrine disorder: Secondary | ICD-10-CM | POA: Diagnosis not present

## 2021-01-15 IMAGING — US US MFM OB FOLLOW-UP
1 series · 14 of 28 positions shown · non-contrast
Comparison: none

[Series 1: us mfm ob follow-up · 14 of 36 slices shown]
[im 2/36]
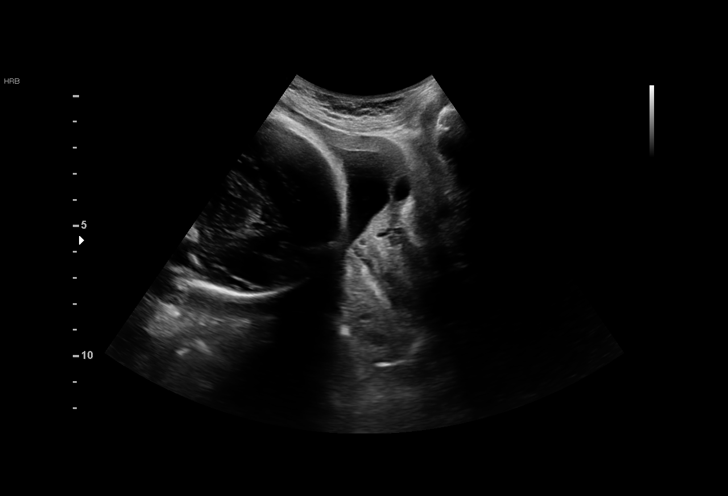
[im 4/36]
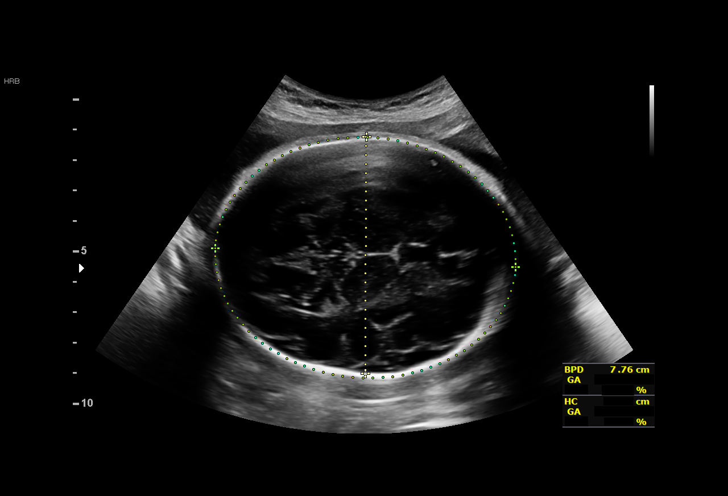
[im 7/36]
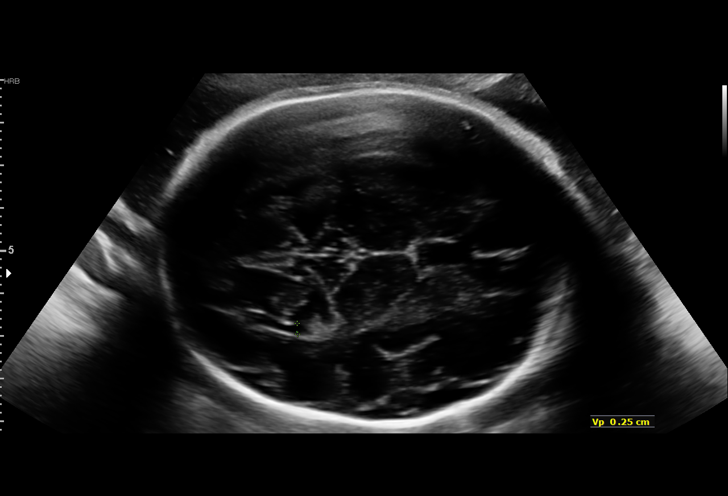
[im 10/36]
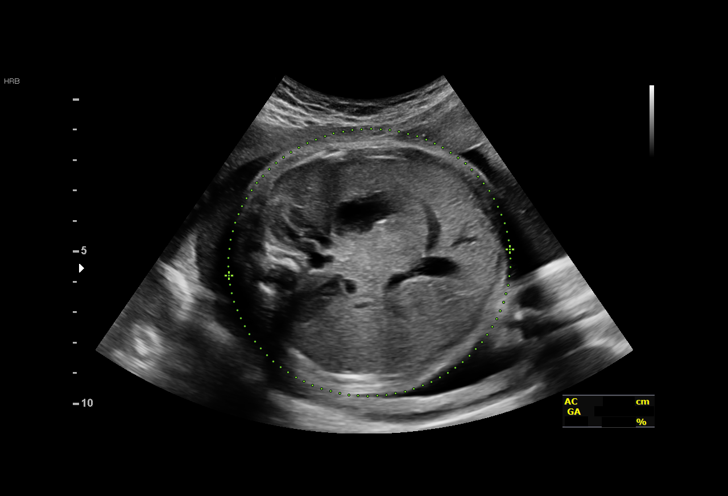
[im 12/36]
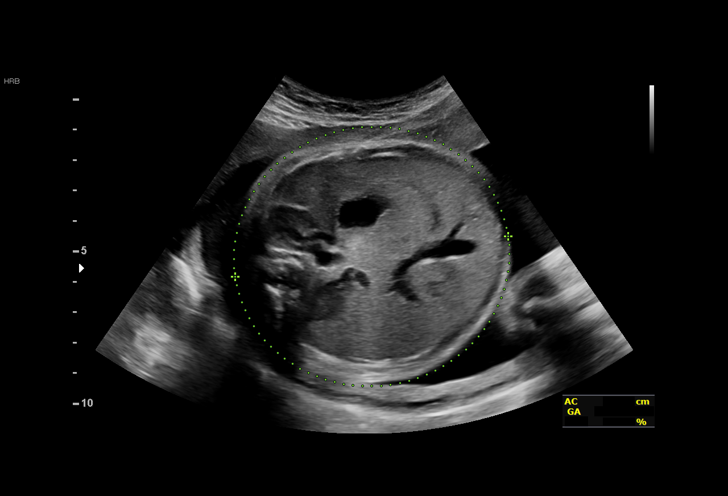
[im 15/36]
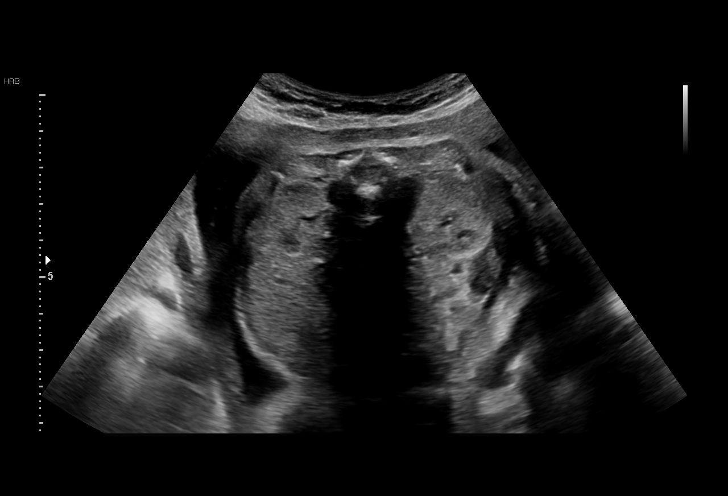
[im 17/36]
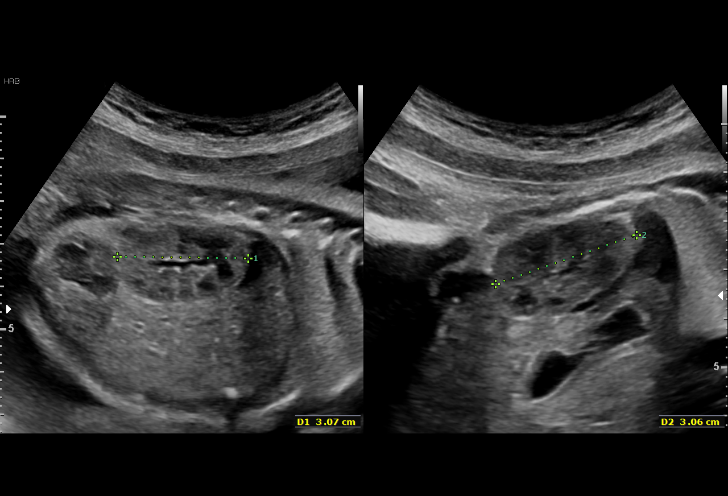
[im 20/36]
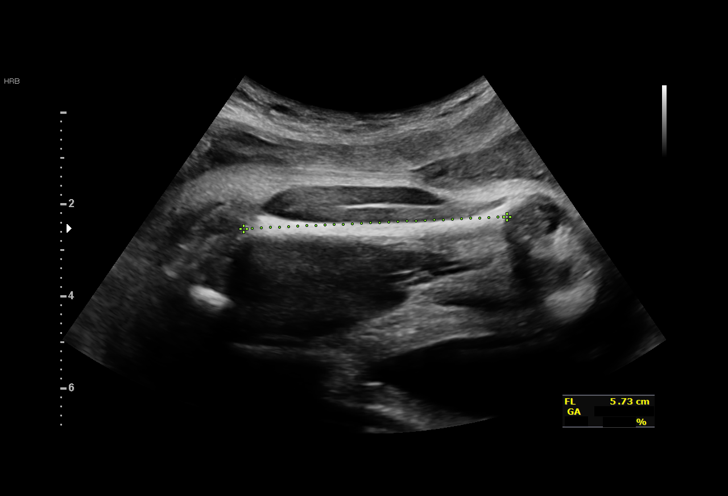
[im 23/36]
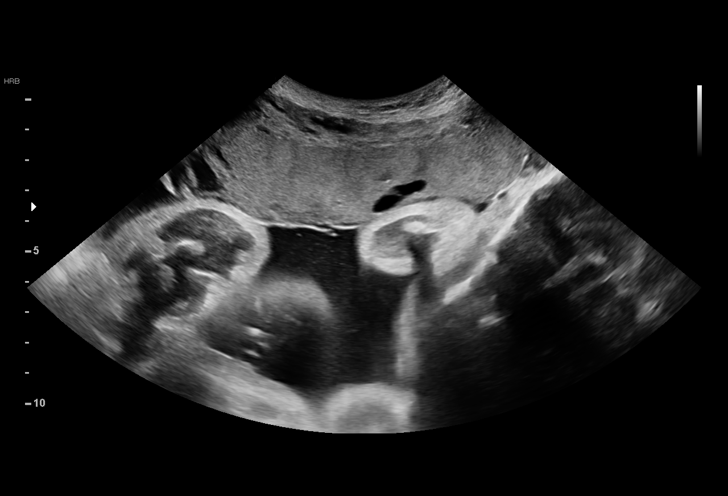
[im 25/36]
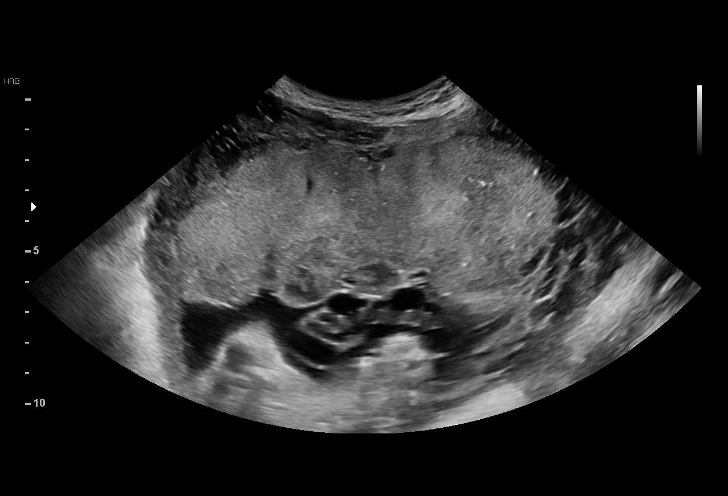
[im 28/36]
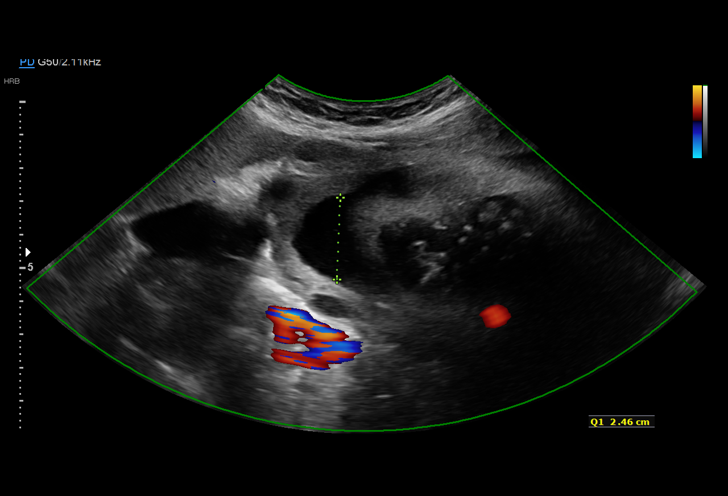
[im 30/36]
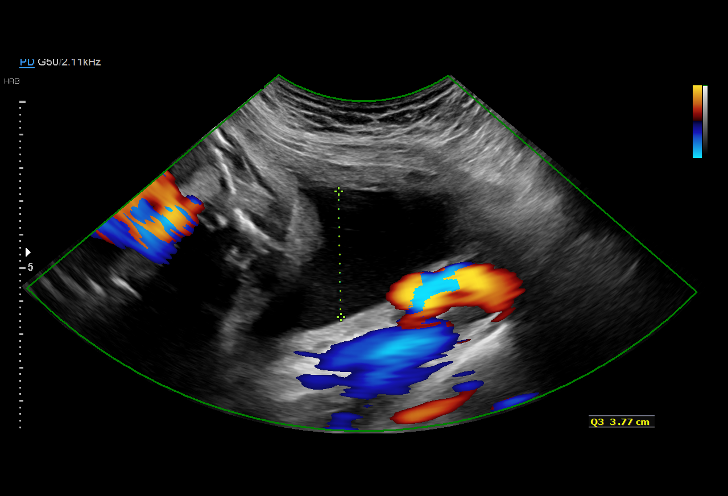
[im 33/36]
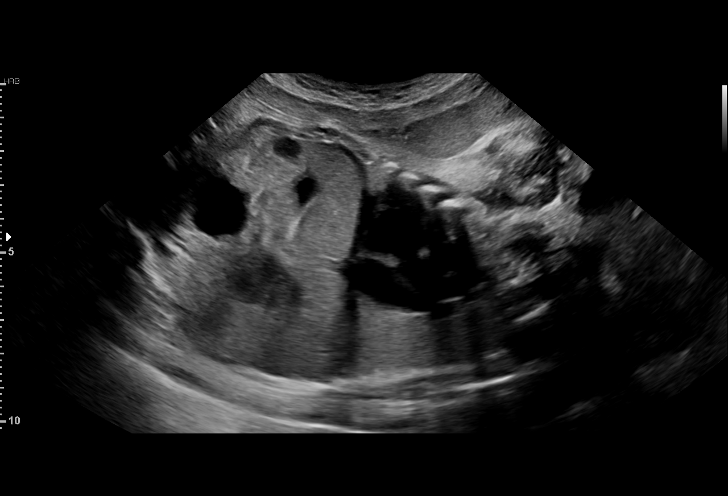
[im 36/36]
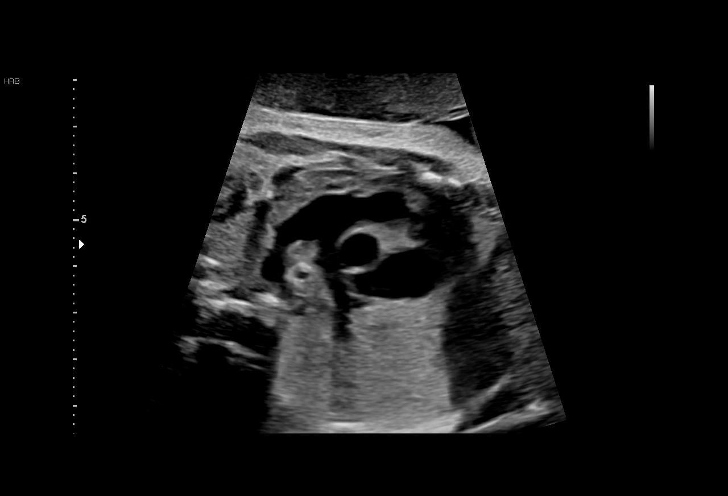

[14 of 28 positions shown; findings below may reference images not displayed]

GEMPEI
 ----------------------------------------------------------------------

 ----------------------------------------------------------------------
Indications

  Encounter for other antenatal screening
  follow-up
  Advanced maternal age multigravida 35+,
  second trimester (low risk NIPS)
  Grand multiparity, antepartum
  31 weeks gestation of pregnancy
 ----------------------------------------------------------------------
Vital Signs

 BMI:
Fetal Evaluation

 Num Of Fetuses:          1
 Fetal Heart Rate(bpm):   143
 Cardiac Activity:        Observed
 Presentation:            Cephalic
 Placenta:                Anterior
 P. Cord Insertion:       Previously Visualized

 Amniotic Fluid
 AFI FV:      Within normal limits

 AFI Sum(cm)     %Tile       Largest Pocket(cm)
 14.69           51

 RUQ(cm)       RLQ(cm)       LUQ(cm)        LLQ(cm)

Biometry

 BPD:      77.6  mm     G. Age:  31w 1d         43  %    CI:        75.59   %    70 - 86
                                                         FL/HC:       20.2  %    19.3 -
 HC:       283   mm     G. Age:  31w 0d         16  %    HC/AC:       1.00       0.96 -
 AC:      281.6  mm     G. Age:  32w 1d         80  %    FL/BPD:      73.7  %    71 - 87
 FL:       57.2  mm     G. Age:  30w 0d         13  %    FL/AC:       20.3  %    20 - 24
 LV:        2.5  mm

 Est. FW:    7077   gm   3 lb 14 oz      49  %
OB History

 Gravidity:    8         Term:   7        Prem:   0        SAB:   0
 TOP:          0       Ectopic:  0        Living: 8
Gestational Age

 LMP:           31w 0d        Date:  08/26/18                 EDD:   06/02/19
 U/S Today:     31w 1d                                        EDD:   06/01/19
 Best:          31w 0d     Det. By:  LMP  (08/26/18)          EDD:   06/02/19
Anatomy

 Cranium:               Appears normal         Aortic Arch:            Previously seen
 Cavum:                 Appears normal         Ductal Arch:            Previously seen
 Ventricles:            Appears normal         Diaphragm:              Appears normal
 Choroid Plexus:        Previously seen        Stomach:                Appears normal, left
                                                                       sided
 Cerebellum:            Previously seen        Abdomen:                Appears normal
 Posterior Fossa:       Previously seen        Abdominal Wall:         Previously seen
 Nuchal Fold:           Previously seen        Cord Vessels:           Previously seen
 Face:                  Orbits and profile     Kidneys:                Appear normal
                        previously seen
 Lips:                  Previously seen        Bladder:                Appears normal
 Thoracic:              Appears normal         Spine:                  Appears normal
 Heart:                 Appears normal         Upper Extremities:      Previously seen
                        (4CH, axis, and
                        situs)
 RVOT:                  Appears normal         Lower Extremities:      Previously seen
 LVOT:                  Previously seen

 Other:  Heels and 5th digit prev visualized. Nasal bone prev visualized.
Cervix Uterus Adnexa

 Cervix
 Not visualized (advanced GA >32wks)
Impression

 Normal interval growth.
 AMA 38 yo
Recommendations

 Continue serial growth in 4 weeks
 Initiate weekly testing at 32 weeks.
 Delivery by 39-40 weeks.
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
## 2022-04-12 ENCOUNTER — Ambulatory Visit (HOSPITAL_COMMUNITY): Payer: Medicaid Other

## 2022-06-02 ENCOUNTER — Other Ambulatory Visit: Payer: Self-pay

## 2022-06-02 ENCOUNTER — Emergency Department (HOSPITAL_COMMUNITY)
Admission: EM | Admit: 2022-06-02 | Discharge: 2022-06-02 | Discharge status: Against Medical Advice | Payer: Medicaid Other | Attending: Emergency Medicine | Admitting: Emergency Medicine

## 2022-06-02 ENCOUNTER — Emergency Department (HOSPITAL_COMMUNITY): Payer: Medicaid Other

## 2022-06-02 DIAGNOSIS — Z5321 Procedure and treatment not carried out due to patient leaving prior to being seen by health care provider: Secondary | ICD-10-CM | POA: Diagnosis not present

## 2022-06-02 DIAGNOSIS — R6884 Jaw pain: Secondary | ICD-10-CM | POA: Diagnosis present

## 2022-06-02 DIAGNOSIS — M542 Cervicalgia: Secondary | ICD-10-CM | POA: Insufficient documentation

## 2022-06-02 DIAGNOSIS — M545 Low back pain, unspecified: Secondary | ICD-10-CM | POA: Insufficient documentation
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
## 2022-06-02 NOTE — Progress Notes (Signed)
Have called for patient in ed lobby twice for their ct with no response.

## 2022-06-02 NOTE — ED Notes (Signed)
Pt not answering for vitals 3x 

## 2022-06-22 ENCOUNTER — Emergency Department (HOSPITAL_COMMUNITY): Payer: Medicaid Other

## 2022-06-22 ENCOUNTER — Emergency Department (HOSPITAL_COMMUNITY)
Admission: EM | Admit: 2022-06-22 | Discharge: 2022-06-23 | Disposition: A | Discharge status: Home / Self Care | Payer: Medicaid Other | Attending: Emergency Medicine | Admitting: Emergency Medicine

## 2022-06-22 DIAGNOSIS — R4182 Altered mental status, unspecified: Secondary | ICD-10-CM | POA: Insufficient documentation

## 2022-06-22 DIAGNOSIS — Z20822 Contact with and (suspected) exposure to covid-19: Secondary | ICD-10-CM | POA: Diagnosis not present

## 2022-06-22 DIAGNOSIS — F29 Unspecified psychosis not due to a substance or known physiological condition: Secondary | ICD-10-CM | POA: Insufficient documentation

## 2022-06-22 DIAGNOSIS — R462 Strange and inexplicable behavior: Secondary | ICD-10-CM | POA: Diagnosis present

## 2022-06-22 DIAGNOSIS — Z87891 Personal history of nicotine dependence: Secondary | ICD-10-CM | POA: Diagnosis not present

## 2022-06-22 LAB — CBC WITH DIFFERENTIAL/PLATELET
Abs Immature Granulocytes: 0.03 10*3/uL (ref 0.00–0.07)
Basophils Absolute: 0 10*3/uL (ref 0.0–0.1)
Basophils Relative: 0 %
Eosinophils Absolute: 0 10*3/uL (ref 0.0–0.5)
Eosinophils Relative: 0 %
HCT: 34.2 % — ABNORMAL LOW (ref 36.0–46.0)
Hemoglobin: 11.9 g/dL — ABNORMAL LOW (ref 12.0–15.0)
Immature Granulocytes: 0 %
Lymphocytes Relative: 6 %
Lymphs Abs: 0.7 10*3/uL (ref 0.7–4.0)
MCH: 30.9 pg (ref 26.0–34.0)
MCHC: 34.8 g/dL (ref 30.0–36.0)
MCV: 88.8 fL (ref 80.0–100.0)
Monocytes Absolute: 0.6 10*3/uL (ref 0.1–1.0)
Monocytes Relative: 5 %
Neutro Abs: 10.5 10*3/uL — ABNORMAL HIGH (ref 1.7–7.7)
Neutrophils Relative %: 89 %
Platelets: 270 10*3/uL (ref 150–400)
RBC: 3.85 MIL/uL — ABNORMAL LOW (ref 3.87–5.11)
RDW: 15.1 % (ref 11.5–15.5)
WBC: 11.9 10*3/uL — ABNORMAL HIGH (ref 4.0–10.5)
nRBC: 0 % (ref 0.0–0.2)

## 2022-06-22 LAB — COMPREHENSIVE METABOLIC PANEL
ALT: 20 U/L (ref 0–44)
AST: 20 U/L (ref 15–41)
Albumin: 4 g/dL (ref 3.5–5.0)
Alkaline Phosphatase: 27 U/L — ABNORMAL LOW (ref 38–126)
Anion gap: 11 (ref 5–15)
BUN: 8 mg/dL (ref 6–20)
CO2: 23 mmol/L (ref 22–32)
Calcium: 9.1 mg/dL (ref 8.9–10.3)
Chloride: 105 mmol/L (ref 98–111)
Creatinine, Ser: 0.81 mg/dL (ref 0.44–1.00)
GFR, Estimated: >60 mL/min (ref >60)
Glucose, Bld: 98 mg/dL (ref 70–99)
Potassium: 3.3 mmol/L — ABNORMAL LOW (ref 3.5–5.1)
Sodium: 139 mmol/L (ref 135–145)
Total Bilirubin: 0.7 mg/dL (ref 0.3–1.2)
Total Protein: 7.2 g/dL (ref 6.5–8.1)

## 2022-06-22 LAB — I-STAT BETA HCG BLOOD, ED (MC, WL, AP ONLY): I-stat hCG, quantitative: <5 m[IU]/mL (ref <5)

## 2022-06-22 LAB — ETHANOL: Alcohol, Ethyl (B): <10 mg/dL (ref <10)

## 2022-06-22 MED ORDER — ZIPRASIDONE MESYLATE 20 MG IM SOLR
20.0000 mg | Freq: Once | INTRAMUSCULAR | Status: AC
  Administered 2022-06-22: 20 mg via INTRAMUSCULAR
  Filled 2022-06-22: qty 20

## 2022-06-22 NOTE — ED Notes (Signed)
Pt increasingly confused , pressured speech and trying to leave. Refusing to sit down . MD Scheving at bedside.

## 2022-06-22 NOTE — ED Notes (Signed)
Pt refused CT due to confusion. MD Scheving aware.
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
## 2022-06-22 NOTE — ED Triage Notes (Signed)
Pt bib [facility] from home daughter called stated she wasn't acting right, daughter states she does not have any medical hx. Daughters dad stated he believes she has hx of bipolar ans schizophrenia. Today presents with paranoia, auditory hallucination, confusion and lack of cooperation. Pt initially refused to go with ems. EMS  gave 55m IM midazolam.  20g lfa 126 cbg 110/70 bp 80hr 100% ra

## 2022-06-23 ENCOUNTER — Emergency Department (HOSPITAL_COMMUNITY): Payer: Medicaid Other

## 2022-06-23 DIAGNOSIS — F29 Unspecified psychosis not due to a substance or known physiological condition: Secondary | ICD-10-CM | POA: Insufficient documentation

## 2022-06-23 LAB — URINALYSIS, ROUTINE W REFLEX MICROSCOPIC
Bilirubin Urine: NEGATIVE
Glucose, UA: NEGATIVE mg/dL
Hgb urine dipstick: NEGATIVE
Ketones, ur: 15 mg/dL — AB
Leukocytes,Ua: NEGATIVE
Nitrite: NEGATIVE
Protein, ur: NEGATIVE mg/dL
Specific Gravity, Urine: 1.015 (ref 1.005–1.030)
pH: 6.5 (ref 5.0–8.0)

## 2022-06-23 LAB — RESP PANEL BY RT-PCR (RSV, FLU A&B, COVID)  RVPGX2
Influenza A by PCR: NEGATIVE
Influenza B by PCR: NEGATIVE
Resp Syncytial Virus by PCR: NEGATIVE
SARS Coronavirus 2 by RT PCR: NEGATIVE

## 2022-06-23 LAB — RAPID URINE DRUG SCREEN, HOSP PERFORMED
Amphetamines: NOT DETECTED
Barbiturates: NOT DETECTED
Benzodiazepines: POSITIVE — AB
Cocaine: NOT DETECTED
Opiates: NOT DETECTED
Tetrahydrocannabinol: POSITIVE — AB

## 2022-06-23 LAB — SALICYLATE LEVEL: Salicylate Lvl: <7 mg/dL — ABNORMAL LOW (ref 7.0–30.0)

## 2022-06-23 LAB — ACETAMINOPHEN LEVEL: Acetaminophen (Tylenol), Serum: <10 ug/mL — ABNORMAL LOW (ref 10–30)

## 2022-06-23 MED ORDER — ZIPRASIDONE MESYLATE 20 MG IM SOLR: 20.0000 mg | INTRAMUSCULAR | Status: DC | PRN

## 2022-06-23 MED ORDER — LORAZEPAM 1 MG PO TABS: 1.0000 mg | ORAL_TABLET | ORAL | Status: DC | PRN

## 2022-06-23 MED ORDER — POTASSIUM CHLORIDE CRYS ER 20 MEQ PO TBCR
40.0000 meq | EXTENDED_RELEASE_TABLET | Freq: Once | ORAL | Status: AC
  Administered 2022-06-23: 40 meq via ORAL
  Filled 2022-06-23: qty 2

## 2022-06-23 MED ORDER — QUETIAPINE FUMARATE 50 MG PO TABS
50.0000 mg | ORAL_TABLET | Freq: Once | ORAL | Status: AC
  Administered 2022-06-23: 50 mg via ORAL
  Filled 2022-06-23: qty 1

## 2022-06-23 MED ORDER — QUETIAPINE FUMARATE ER 50 MG PO TB24
50.0000 mg | ORAL_TABLET | Freq: Every day | ORAL | Status: DC
  Filled 2022-06-23 (×2): qty 1

## 2022-06-23 MED ORDER — NICOTINE 21 MG/24HR TD PT24
21.0000 mg | MEDICATED_PATCH | Freq: Every day | TRANSDERMAL | Status: DC
  Administered 2022-06-23: 21 mg via TRANSDERMAL
  Filled 2022-06-23: qty 1

## 2022-06-23 MED ORDER — RISPERIDONE 1 MG PO TBDP: 2.0000 mg | ORAL_TABLET | Freq: Three times a day (TID) | ORAL | Status: DC | PRN

## 2022-06-23 MED ORDER — MIDAZOLAM HCL 2 MG/2ML IJ SOLN
1.0000 mg | Freq: Once | INTRAMUSCULAR | Status: AC
  Administered 2022-06-23: 1 mg via INTRAMUSCULAR
  Filled 2022-06-23: qty 2

## 2022-06-23 MED ORDER — ACETAMINOPHEN 325 MG PO TABS: 650.0000 mg | ORAL_TABLET | ORAL | Status: DC | PRN

## 2022-06-23 MED ORDER — DIPHENHYDRAMINE HCL 50 MG/ML IJ SOLN
25.0000 mg | Freq: Once | INTRAMUSCULAR | Status: AC
  Administered 2022-06-23: 25 mg via INTRAMUSCULAR
  Filled 2022-06-23: qty 1

## 2022-06-23 NOTE — ED Notes (Signed)
Pt wanded.  Belongings in locker 6.

## 2022-06-23 NOTE — Progress Notes (Signed)
Pt was accepted to [facility] 06/23/22; Bed Assignment 504-01  Pt meets inpatient criteria per Vesta Mixer, NP  Attending Physician will be Dr. Janine Limbo  Report can be called to: Adult unit: [phone]  Pt can arrive after 0100AM  Per [facility] Provider, please include admission orders and agitation protocol. Registration, please pre-admit.   Care Team notified: Negley, Godfrey Pick, MD, Josephina Gip, RN, Henderson Newcomer, RN, Lewis Shock, RN, Oretha Caprice, RN, Vesta Mixer, NP   Nadara Mode, Rosalia 06/23/2022 @ 12:08 PM

## 2022-06-23 NOTE — ED Provider Notes (Signed)
I assumed care in signout.  Patient is still agitated even after getting Geodon.  She is in restraints.  Meds have been ordered.    Ripley Fraise, MD 06/23/22 [phone]

## 2022-06-23 NOTE — ED Notes (Signed)
[name] (daughter) [phone] She would like an update

## 2022-06-23 NOTE — ED Notes (Signed)
Pt has been moved to purple zone.  She is calm and cooperative, has been dressed out and is eating breakfast.

## 2022-06-23 NOTE — ED Provider Notes (Signed)
I discussed CT findings with Dr. Brett Fairy the radiologist.  She reports there is a vague hypodense region in the posterior aspect, but no other acute findings.  She recommended MRI, but this can be done on a nonemergent basis as it is very unlikely the patient is having a CVA.  Patient is moving all extremities without difficulty.  CT head was performed more to rule out space-occupying lesion or intracranial hemorrhage, CVA was lower on the differential.  MRI can be done as an outpatient or when patient is more cooperative.  Patient's agitation is improved, but she is still very confused and now have higher suspicion this is psychosis.  Patient will be evaluated by psychiatry She is afebrile, labs are overall unremarkable.  Medications  acetaminophen (TYLENOL) tablet 650 mg (has no administration in time range)  nicotine (NICODERM CQ - dosed in mg/24 hours) patch 21 mg (has no administration in time range)  risperiDONE (RISPERDAL M-TABS) disintegrating tablet 2 mg (has no administration in time range)    And  LORazepam (ATIVAN) tablet 1 mg (has no administration in time range)    And  ziprasidone (GEODON) injection 20 mg (has no administration in time range)  ziprasidone (GEODON) injection 20 mg (20 mg Intramuscular Given 06/22/22 2350)  diphenhydrAMINE (BENADRYL) injection 25 mg (25 mg Intramuscular Given 06/23/22 0144)  midazolam (VERSED) injection 1 mg (1 mg Intramuscular Given 06/23/22 0146)     .Critical Care  Performed by: Ripley Fraise, MD Authorized by: Ripley Fraise, MD   Critical care provider statement:    Critical care time (minutes):  80   Critical care start time:  06/23/2022 1:30 AM   Critical care end time:  06/23/2022 2:50 AM   Critical care time was exclusive of:  Separately billable procedures and treating other patients   Critical care was necessary to treat or prevent imminent or life-threatening deterioration of the following conditions:  CNS failure or  compromise and toxidrome   Critical care was time spent personally by me on the following activities:  Examination of patient, evaluation of patient's response to treatment, discussions with consultants, ordering and review of radiographic studies, re-evaluation of patient's condition, ordering and review of laboratory studies and ordering and performing treatments and interventions   I assumed direction of critical care for this patient from another provider in my specialty: no       Ripley Fraise, MD 06/23/22 [phone]

## 2022-06-23 NOTE — ED Notes (Signed)
Pt used bedside commode but then refused to allow me to collect urine. Patient took urine and threw it in the sink.

## 2022-06-23 NOTE — ED Notes (Signed)
Restraints removed because patient stated she needed to urinate, then refused to get out of the bed to use the bathroom. Kept stating "this is all weird". This nurse reapplied restraints with assistance of security.

## 2022-06-23 NOTE — ED Notes (Signed)
NS informed RN pt daughter Felicia Griffin called to get an update. RN informed pt that if she would like to call daughter once moved to new room she can. RN informed pt she will need to give [place] permission to discuss care with daughter. Pt says, "Ok."

## 2022-06-23 NOTE — ED Notes (Signed)
Pt has been calm cooperative, alert and oriented. Pt has been taking medications without any difficulty.  Pt and daughter have been notified that she is to transfer to [facility] and she does not want that.  Note sent to NP.

## 2022-06-23 NOTE — ED Notes (Signed)
Pt making first phone call.

## 2022-06-23 NOTE — Consult Note (Signed)
[facility] ED ASSESSMENT   Reason for Consult:  Eval Referring Physician:  Dr. Christy Gentles Patient Identification: Felicia Griffin MRN:  [id number] ED Chief Complaint: Psychosis [facility])  Diagnosis:  Principal Problem:   Psychosis [facility])   ED Assessment Time Calculation: Start Time: 1000 Stop Time: 1050 Total Time in Minutes (Assessment Completion): 50   HPI:   Felicia Griffin is a 42 y.o. female patient without significant past medical history presenting to the emergency department with altered mental status. Per daughter, since Thursday patient has not really slept much at all. She also is having some strange speech, being paranoid and seemed confused. She reports she was talking about a "trial" and being concerned with some sort of persecution. No drug or alcohol use. As far as daughter knows has not had any episodes previously. Possibly was told she has bipolar disorder, pt originally from [place]. Pt received multiple tests and laboratory work while in ED and presumed medically clear, therefore psychiatry has been consulted. Pt became very agitated through out the night, and received PRN medications and physical restraints. Appears patient did not sleep much through out the night and was minimally cooperative.   Subjective:   Patient seen this [facility] at [facility] ED for face-to-face psychiatric evaluation.  She is sitting up in her bed, awake and pleasant, and willing to engage in assessment.  Last night she was documented as combative, confused, altered mental status.  This morning she has bright affect, alert and oriented x 4, and able to answer most questions appropriately.  Patient is not able to give much information about the events that led her to the hospital or anything that happened last night.  She did confirm she has been very stressed recently, she is a single mother of 6 kids.  Ages are 3, 47, 79, 60, 30, and 77.  She reports sleeping minimally recently, she also is employed full-time.   All of her kids currently lives with her.  She tells me the father of her children/ex is currently watching the kids for her.  She does tell me she grew up in [place], and was previously diagnosed with bipolar disorder she thinks in 2003.  She states she had an episode where she was not sleeping well and does not remember a lot and received psychiatric inpatient treatment.  She denies any psychiatric inpatient stays or hospitalizations since 2003.  She does currently see a therapist, and was started on Lexapro 10 mg.  She states she has been taking this for almost a year however she does not take it consistently.  She reports due to her increased stress and anxiety she has been taking it consistently for a few weeks now.  She does endorse moods of little sleep, high energy, high motivation followed by periods of lower mood and energy.  She states that she has not experienced any high energy episodes since 2003.  She does endorse occasional marijuana use, denies any other illicit substances.  Reports occasional glass of wine throughout the week.  She denies any suicidal ideations.  Denies any previous suicide attempts.  Denies homicidal ideations.  Denies any auditory or visual hallucinations.  Ultimately patient has had drastic improvements within the last 6 hours.  At 5 AM she was cursing, bizarre, throwing her urine in the sink, appeared confused, and very combative requiring as needed medications.  She was in physical restraints throughout the night.  However this morning she is calm, cooperative, pleasant, does not appear confused or bizarre.  Her speech is pressured and rapid, her thought content is circumstantial but she is easily redirected to topic.  Difficult to tell if this is possibly substance induced psychosis, patient has not been cooperative with urine toxicology screening yet.  Manic episode could have been induced by initiation of Lexapro and taking this medication consistently over the past few  weeks.  She does report high stress and lack of sleep which could have also triggered mania/psychosis.  I spoke with her daughter, Felicia Griffin, at [phone].  She states patient started acting bizarre and paranoid at home, however she has not slept in what she thinks around 1 week which is why she forced her to come the hospital.  Patient stated "To my knowledge she isn't crazy or anything she never acts like this. She will sometimes not sleep for a day or two and I can tell she starts acting different, but I just make her sleep and after a good nights sleep she acts normal again. I don't think she is crazy I just think she is sleep deprived." Daughter was unsure about bipolar diagnosis and was not certain about any medications she could be taking.   Due to severity of patient's altered mental status, agitation, paranoid statements, and lack of sleep will recommend inpatient psychiatric treatment for further stabilization and medication management.    Past Psychiatric History:  Possible diagnosis of bipolar disorder  Risk to Self or Others: Is the patient at risk to self? Yes Has the patient been a risk to self in the past 6 months? No Has the patient been a risk to self within the distant past? No Is the patient a risk to others? Yes Has the patient been a risk to others in the past 6 months? No Has the patient been a risk to others within the distant past? No  [place] Scale:  [place] ED from 06/02/2022 in [facility] Emergency Department at [place] No Risk       Past Medical History:  Past Medical History:  Diagnosis Date   Medical history non-contributory     Past Surgical History:  Procedure Laterality Date   NO PAST SURGERIES     TUBAL LIGATION N/A 06/03/2019   Procedure: POST PARTUM TUBAL LIGATION;  Surgeon: Aletha Halim, MD;  Location: [facility] LD ORS;  Service: Gynecology;  Laterality: N/A;   Family History:  Family History   Problem Relation Age of Onset   Breast cancer Sister    Social History:  Social History   Substance and Sexual Activity  Alcohol Use Not Currently     Social History   Substance and Sexual Activity  Drug Use Never    Social History   Socioeconomic History   Marital status: Single    Spouse name: Not on file   Number of children: Not on file   Years of education: Not on file   Highest education level: Not on file  Occupational History   Not on file  Tobacco Use   Smoking status: Former    Types: Cigars, Cigarettes    Quit date: 01/05/2017    Years since quitting: 5.4   Smokeless tobacco: Never  Vaping Use   Vaping Use: Never used  Substance and Sexual Activity   Alcohol use: Not Currently   Drug use: Never   Sexual activity: Yes    Partners: Male  Other Topics Concern   Not on file  Social History Narrative   Not on  file   Social Determinants of Health   Financial Resource Strain: Not on file  Food Insecurity: Not on file  Transportation Needs: Not on file  Physical Activity: Not on file  Stress: Not on file  Social Connections: Not on file   Additional Social History:    Allergies:  No Known Allergies  Labs:  Results for orders placed or performed during the hospital encounter of 06/22/22 (from the past 48 hour(s))  Comprehensive metabolic panel     Status: Abnormal   Collection Time: 06/22/22  7:53 PM  Result Value Ref Range   Sodium 139 135 - 145 mmol/L   Potassium 3.3 (L) 3.5 - 5.1 mmol/L   Chloride 105 98 - 111 mmol/L   CO2 23 22 - 32 mmol/L   Glucose, Bld 98 70 - 99 mg/dL    Comment: Glucose reference range applies only to samples taken after fasting for at least 8 hours.   BUN 8 6 - 20 mg/dL   Creatinine, Ser 0.81 0.44 - 1.00 mg/dL   Calcium 9.1 8.9 - 10.3 mg/dL   Total Protein 7.2 6.5 - 8.1 g/dL   Albumin 4.0 3.5 - 5.0 g/dL   AST 20 15 - 41 U/L   ALT 20 0 - 44 U/L   Alkaline Phosphatase 27 (L) 38 - 126 U/L   Total Bilirubin 0.7 0.3 -  1.2 mg/dL   GFR, Estimated >60 >60 mL/min    Comment: (NOTE) Calculated using the CKD-EPI Creatinine Equation (2021)    Anion gap 11 5 - 15    Comment: Performed at [facility] [address] [place], [place] [address]  Ethanol     Status: None   Collection Time: 06/22/22  7:53 PM  Result Value Ref Range   Alcohol, Ethyl (B) <10 <10 mg/dL    Comment: (NOTE) Lowest detectable limit for serum alcohol is 10 mg/dL.  For medical purposes only. Performed at [facility], [place] [address] [place], [place] [address]   CBC with Diff     Status: Abnormal   Collection Time: 06/22/22  7:53 PM  Result Value Ref Range   WBC 11.9 (H) 4.0 - 10.5 K/uL   RBC 3.85 (L) 3.87 - 5.11 MIL/uL   Hemoglobin 11.9 (L) 12.0 - 15.0 g/dL   HCT 34.2 (L) 36.0 - 46.0 %   MCV 88.8 80.0 - 100.0 fL   MCH 30.9 26.0 - 34.0 pg   MCHC 34.8 30.0 - 36.0 g/dL   RDW 15.1 11.5 - 15.5 %   Platelets 270 150 - 400 K/uL   nRBC 0.0 0.0 - 0.2 %   Neutrophils Relative % 89 %   Neutro Abs 10.5 (H) 1.7 - 7.7 K/uL   Lymphocytes Relative 6 %   Lymphs Abs 0.7 0.7 - 4.0 K/uL   Monocytes Relative 5 %   Monocytes Absolute 0.6 0.1 - 1.0 K/uL   Eosinophils Relative 0 %   Eosinophils Absolute 0.0 0.0 - 0.5 K/uL   Basophils Relative 0 %   Basophils Absolute 0.0 0.0 - 0.1 K/uL   Immature Granulocytes 0 %   Abs Immature Granulocytes 0.03 0.00 - 0.07 K/uL    Comment: Performed at [facility], [place] [address], [place], [place] [address]  Acetaminophen level     Status: Abnormal   Collection Time: 06/22/22  7:53 PM  Result Value Ref Range   Acetaminophen (Tylenol), Serum <10 (L) 10 - 30 ug/mL    Comment: (  NOTE) Therapeutic concentrations vary significantly. A range of 10-30 ug/mL  may be an effective concentration for many patients. However, some  are best treated at concentrations outside of this range. Acetaminophen concentrations >150 ug/mL at 4 hours after ingestion  and >50 ug/mL at 12 hours after ingestion  are often associated with  toxic reactions.  Performed at [facility], [place] [address], [place], [place] [id number]   Salicylate level     Status: Abnormal   Collection Time: 06/22/22  7:53 PM  Result Value Ref Range   Salicylate Lvl Q000111Q (L) 7.0 - 30.0 mg/dL    Comment: Performed at [facility] [address] [place], [place] [address]  I-Stat beta hCG blood, ED     Status: None   Collection Time: 06/22/22  8:27 PM  Result Value Ref Range   I-stat hCG, quantitative <5.0 <5 mIU/mL   Comment 3            Comment:   GEST. AGE      CONC.  (mIU/mL)   <=1 WEEK        5 - 50     2 WEEKS       50 - 500     3 WEEKS       100 - 10,000     4 WEEKS     1,000 - 30,000        FEMALE AND NON-PREGNANT FEMALE:     LESS THAN 5 mIU/mL     Current Facility-Administered Medications  Medication Dose Route Frequency Provider Last Rate Last Admin   acetaminophen (TYLENOL) tablet 650 mg  650 mg Oral Q4H PRN Ripley Fraise, MD       risperiDONE (RISPERDAL M-TABS) disintegrating tablet 2 mg  2 mg Oral Q8H PRN Ripley Fraise, MD       And   LORazepam (ATIVAN) tablet 1 mg  1 mg Oral PRN Ripley Fraise, MD       And   ziprasidone (GEODON) injection 20 mg  20 mg Intramuscular PRN Ripley Fraise, MD       nicotine (NICODERM CQ - dosed in mg/24 hours) patch 21 mg  21 mg Transdermal Daily Ripley Fraise, MD       QUEtiapine (SEROQUEL XR) 24 hr tablet 50 mg  50 mg Oral QHS Vesta Mixer, NP       QUEtiapine (SEROQUEL) tablet 50 mg  50 mg Oral Once Vesta Mixer, NP       Current Outpatient Medications  Medication Sig Dispense Refill   escitalopram (LEXAPRO) 10 MG tablet Take 10 mg by mouth daily.     acetaminophen (TYLENOL) 325 MG tablet Take 2 tablets (650 mg total) by mouth every 6 (six) hours as needed (for pain scale < 4). 30 tablet 0   Blood Pressure KIT Monitor BP readings at home regularly O09.90 Large (Patient not taking: Reported on 07/15/2019) 1 kit 0   Blood Pressure  Monitor KIT 1 Device by Does not apply route once a week. To be monitored Regularly at home. (Patient not taking: Reported on 07/15/2019) 1 kit 0   Psychiatric Specialty Exam: Presentation  General Appearance:  Fairly Groomed  Eye Contact: Good  Speech: Pressured  Speech Volume: Normal  Handedness:No data recorded  Mood and Affect  Mood: Euphoric  Affect: Congruent   Thought Process  Thought Processes: Goal Directed  Descriptions of Associations:Circumstantial  Orientation:Full (Time, Place and Person)  Thought Content:Logical  History of Schizophrenia/Schizoaffective disorder:No data  recorded Duration of Psychotic Symptoms:No data recorded Hallucinations:Hallucinations: None  Ideas of Reference:None  Suicidal Thoughts:Suicidal Thoughts: No  Homicidal Thoughts:Homicidal Thoughts: No   Sensorium  Memory: Immediate Fair; Recent Fair  Judgment: Impaired  Insight: Fair   Materials engineer: Fair  Attention Span: Fair  Recall: [place] of Knowledge: Good  Language: Good   Psychomotor Activity  Psychomotor Activity: Psychomotor Activity: Normal   Assets  Assets: Desire for Improvement; Leisure Time; Physical Health; Resilience    Sleep  Sleep: Sleep: Poor   Physical Exam: Physical Exam Neurological:     Mental Status: She is alert and oriented to person, place, and time.  Psychiatric:        Attention and Perception: Attention normal.        Speech: Speech is rapid and pressured.        Behavior: Behavior is cooperative.    Review of Systems  Psychiatric/Behavioral:  The patient is nervous/anxious and has insomnia.        Bizarre behaviors, insomnia x1 week, pressured speech   Blood pressure (!) 130/97, pulse 91, temperature 97.9 F (36.6 C), temperature source Axillary, resp. rate 18, SpO2 100 %, unknown if currently breastfeeding. There is no height or weight on file to calculate BMI.  Medical  Decision Making: Pt case reviewed and discussed with Dr. Caswell Corwin. Will recommend inpatient psychiatric treatment. Pt has been accepted to [facility] bed [id number]. ED staff notified.   - discontinue lexapro for now - start Seroquel 50 mg XR Qhs  Disposition: Recommend psychiatric Inpatient admission when medically cleared.  Vesta Mixer, NP 06/23/2022 11:28 AM

## 2022-06-23 NOTE — ED Provider Notes (Signed)
Emergency Medicine Observation Re-evaluation Note  Felicia Griffin is a 42 y.o. female, seen on rounds today.  Pt initially presented to the ED for complaints of Psychiatric Evaluation Currently, the patient is eating breakfast.  Physical Exam  BP (!) 130/97   Pulse 91   Temp 97.9 F (36.6 C) (Axillary)   Resp 18   SpO2 100%  Physical Exam General: Awake, alert, nondistressed Cardiac: Extremities well-perfused Lungs: Breathing is unlabored Psych: No agitation  ED Course / MDM  EKG:EKG Interpretation  Date/Time:  Saturday June 22 2022 19:41:56 EST Ventricular Rate:  77 PR Interval:  161 QRS Duration: 89 QT Interval:  382 QTC Calculation: 433 R Axis:   73 Text Interpretation: Sinus rhythm Anteroseptal infarct, age indeterminate Confirmed by Garnette Gunner [phone]) on 06/22/2022 8:41:15 PM  I have reviewed the labs performed to date as well as medications administered while in observation.  Recent changes in the last 24 hours include arrival in the emergency department yesterday for altered mental status.  There was report of poor sleep and delusions of persecution.  She had agitation overnight and received Geodon and restraints.  IVC paperwork was completed.  TTS attempted to evaluate early this morning but patient was not fully cooperative.  Plan is for reassessment today.  Plan  Current plan is for TTS evaluation.    Godfrey Pick, MD 06/23/22 1125

## 2022-06-23 NOTE — ED Notes (Signed)
IVC paperwork complete, expires 06/29/2022 in orange zone.

## 2022-06-23 NOTE — BH Assessment (Signed)
Per medical record, Pt's daughter called EMS due to Pt exhibiting usual behavior. Per daughter, since Thursday patient has not really slept much at all. She also is having some strange speech, being paranoid and seemed confused. She reports she was talking about a "trial" and being concerned with some sort of persecution. While in ED she appeared confused with pressured speech. She became agitated, attempted to leave, and was given Geodon, placed in restraints, and petitioned for involuntary commitment by EDP.  Attempted tele-assessment but was not able to obtain enough information to complete a comprehensive clinical assessment. She is sitting up but keeps her eyes closed most of the time and appears sedated. Pt is able to say the year is 2024 and that she is in a hospital. She acknowledges she has episodes of paranoia. She says she receives mental health services through [facility] and that she has been taking "one" of her medications. She denies thoughts of harming others. She did not respond when asked about suicidal ideation or hallucinations. She either did not want to answer questions or was unable to answer due to mental status. She says she wants to speak to someone on the telephone. She did not respond when asked if TTS could speak to her daughter or another family member.  Discussed case with Quintella Reichert, NP who recommended Pt be evaluated by psychiatry later this morning. Notified Dr. Ripley Fraise and Colan Neptune, RN of recommendation via secure message.   Evelena Peat, [facility], [facility] Triage Specialist [phone]

## 2022-06-23 NOTE — ED Notes (Signed)
Pt speaking with tts

## 2022-06-23 NOTE — ED Notes (Signed)
RN and NT assisted pt with getting dressed. Pt was expressing not knowing what was going on. RN updated pt on hx and current status of admission. Pt verbalized understanding and cooperated with protocol.

## 2022-06-23 NOTE — ED Notes (Signed)
Pt states she had a situation like this previously d/t no rest. Pt states once she gets enough rest she bounce back to her normal self.

## 2022-06-23 NOTE — ED Notes (Signed)
Call from [employer] asking if patient could have a visitor, said it was her son. This nurse asked patient if her son could come back to visit patient stated she did not know that person.

## 2022-06-24 ENCOUNTER — Encounter (HOSPITAL_COMMUNITY): Payer: Self-pay

## 2022-06-24 ENCOUNTER — Encounter (HOSPITAL_COMMUNITY): Payer: Self-pay | Admitting: Nurse Practitioner

## 2022-06-24 ENCOUNTER — Other Ambulatory Visit: Payer: Self-pay

## 2022-06-24 ENCOUNTER — Inpatient Hospital Stay (HOSPITAL_COMMUNITY)
Admission: AD | Admit: 2022-06-24 | Discharge: 2022-06-26 | DRG: 885 | Disposition: A | Discharge status: Home / Self Care | Payer: Medicaid Other | Source: Intra-hospital | Attending: Psychiatry | Admitting: Psychiatry

## 2022-06-24 DIAGNOSIS — Z79899 Other long term (current) drug therapy: Secondary | ICD-10-CM | POA: Diagnosis not present

## 2022-06-24 DIAGNOSIS — K5900 Constipation, unspecified: Secondary | ICD-10-CM | POA: Diagnosis present

## 2022-06-24 DIAGNOSIS — I10 Essential (primary) hypertension: Secondary | ICD-10-CM | POA: Diagnosis present

## 2022-06-24 DIAGNOSIS — F1210 Cannabis abuse, uncomplicated: Secondary | ICD-10-CM | POA: Diagnosis present

## 2022-06-24 DIAGNOSIS — F29 Unspecified psychosis not due to a substance or known physiological condition: Secondary | ICD-10-CM | POA: Diagnosis present

## 2022-06-24 DIAGNOSIS — Z91199 Patient's noncompliance with other medical treatment and regimen due to unspecified reason: Secondary | ICD-10-CM | POA: Diagnosis not present

## 2022-06-24 DIAGNOSIS — F17210 Nicotine dependence, cigarettes, uncomplicated: Secondary | ICD-10-CM | POA: Diagnosis present

## 2022-06-24 DIAGNOSIS — F312 Bipolar disorder, current episode manic severe with psychotic features: Principal | ICD-10-CM | POA: Diagnosis present

## 2022-06-24 DIAGNOSIS — G4700 Insomnia, unspecified: Secondary | ICD-10-CM | POA: Diagnosis present

## 2022-06-24 DIAGNOSIS — K30 Functional dyspepsia: Secondary | ICD-10-CM | POA: Diagnosis present

## 2022-06-24 DIAGNOSIS — F411 Generalized anxiety disorder: Secondary | ICD-10-CM | POA: Diagnosis present

## 2022-06-24 DIAGNOSIS — F4310 Post-traumatic stress disorder, unspecified: Secondary | ICD-10-CM | POA: Diagnosis present

## 2022-06-24 DIAGNOSIS — Z20822 Contact with and (suspected) exposure to covid-19: Secondary | ICD-10-CM | POA: Diagnosis present

## 2022-06-24 DIAGNOSIS — Z634 Disappearance and death of family member: Secondary | ICD-10-CM | POA: Diagnosis not present

## 2022-06-24 DIAGNOSIS — Z830 Family history of human immunodeficiency virus [HIV] disease: Secondary | ICD-10-CM

## 2022-06-24 DIAGNOSIS — F17200 Nicotine dependence, unspecified, uncomplicated: Secondary | ICD-10-CM | POA: Diagnosis present

## 2022-06-24 DIAGNOSIS — Z781 Physical restraint status: Secondary | ICD-10-CM | POA: Diagnosis not present

## 2022-06-24 MED ORDER — QUETIAPINE FUMARATE ER 50 MG PO TB24
100.0000 mg | ORAL_TABLET | Freq: Every day | ORAL | Status: DC
  Administered 2022-06-24 – 2022-06-25 (×2): 100 mg via ORAL
  Filled 2022-06-24 (×5): qty 2

## 2022-06-24 MED ORDER — NICOTINE 21 MG/24HR TD PT24
21.0000 mg | MEDICATED_PATCH | Freq: Every day | TRANSDERMAL | Status: DC
  Filled 2022-06-24 (×4): qty 1

## 2022-06-24 MED ORDER — RISPERIDONE 2 MG PO TBDP: 2.0000 mg | ORAL_TABLET | Freq: Three times a day (TID) | ORAL | Status: DC | PRN

## 2022-06-24 MED ORDER — ZIPRASIDONE MESYLATE 20 MG IM SOLR: 20.0000 mg | Freq: Two times a day (BID) | INTRAMUSCULAR | Status: DC | PRN

## 2022-06-24 MED ORDER — SODIUM CHLORIDE 0.9 % IN NEBU
INHALATION_SOLUTION | RESPIRATORY_TRACT | Status: AC
  Filled 2022-06-24: qty 3

## 2022-06-24 MED ORDER — ALUM & MAG HYDROXIDE-SIMETH 200-200-20 MG/5ML PO SUSP: 30.0000 mL | ORAL | Status: DC | PRN

## 2022-06-24 MED ORDER — QUETIAPINE FUMARATE ER 50 MG PO TB24
50.0000 mg | ORAL_TABLET | Freq: Every day | ORAL | Status: DC
  Filled 2022-06-24 (×2): qty 1

## 2022-06-24 MED ORDER — HYDROXYZINE HCL 25 MG PO TABS: 25.0000 mg | ORAL_TABLET | Freq: Three times a day (TID) | ORAL | Status: DC | PRN

## 2022-06-24 MED ORDER — WHITE PETROLATUM EX OINT
TOPICAL_OINTMENT | CUTANEOUS | Status: AC
  Administered 2022-06-24: 1
  Filled 2022-06-24: qty 10

## 2022-06-24 MED ORDER — ACETAMINOPHEN 325 MG PO TABS: 650.0000 mg | ORAL_TABLET | ORAL | Status: DC | PRN

## 2022-06-24 MED ORDER — LORAZEPAM 1 MG PO TABS: 1.0000 mg | ORAL_TABLET | Freq: Four times a day (QID) | ORAL | Status: DC | PRN

## 2022-06-24 NOTE — [facility] Suicide Risk Assessment (Signed)
[place] INPATIENT:  Family/Significant Other Suicide Prevention Education  Suicide Prevention Education:  Education Completed; [name] (daughter) -[phone]      ,  (name of family member/significant other) has been identified by the patient as the family member/significant other with whom the patient will be residing, and identified as the person(s) who will aid the patient in the event of a mental health crisis (suicidal ideations/suicide attempt).  With written consent from the patient, the family member/significant other has been provided the following suicide prevention education, prior to the and/or following the discharge of the patient.  CSW spoke with daughter who has no safety concerns for her mother.  No access to guns/weapons.  Daughter reports that prior to hospitalization her mother was acting confused but no aggression or violence.  Daughter believes patient is ready for discharge.   The suicide prevention education provided includes the following: Suicide risk factors Suicide prevention and interventions National Suicide Hotline telephone number [facility] assessment telephone number [facility] Emergency Assistance [place] and/or Residential Mobile Crisis Unit telephone number  Request made of family/significant other to: Remove weapons (e.g., guns, rifles, knives), all items previously/currently identified as safety concern.   Remove drugs/medications (over-the-counter, prescriptions, illicit drugs), all items previously/currently identified as a safety concern.  The family member/significant other verbalizes understanding of the suicide prevention education information provided.  The family member/significant other agrees to remove the items of safety concern listed above.  Tarisa Paola E Kenzie Flakes 06/24/2022, 3:31 PM

## 2022-06-24 NOTE — Group Note (Signed)
Recreation Therapy Group Note   Group Topic: Decision Making Group Date: 06/24/2022 Start Time: [id number] End Time: 1100 Facilitators: Skylynn Burkley-McCall, LRT,CTRS Location: [address] Dayroom  Goal Area(s) Addresses:  Patient will effectively work with peer towards shared goal.  Patient will identify factors that guided their decision making.  Patient will pro-socially communicate ideas during group session.   Group Description:  Patients were given a scenario that they were going to be stranded on a deserted [place] for several months before being rescued. Writer tasked them with making a list of 15 things they would choose to bring with them for "survival". The list of items was prioritized most important to least. Each patient would come up with their own list, then work together to create a new list of 15 items while in a group of 3-5 peers. LRT discussed each person's list and how it differed from others. The debrief included discussion of priorities, good decisions versus bad decisions, and how it is important to think before acting so we can make the best decision possible. LRT tied the concept of effective communication among group members to patient's support systems outside of the hospital and its benefit post discharge.   Affect/Mood: Appropriate   Participation Level: Engaged   Participation Quality: Independent   Behavior: Appropriate   Speech/Thought Process: Delusional   Insight: Good   Judgement: Good   Modes of Intervention: Decision Making   Patient Response to Interventions:  Engaged   Education Outcome:  Acknowledges education and In group clarification offered    Clinical Observations/Individualized Feedback: Pt was quiet but bright.  Pt would engage when prompted.  A few things pt identified she would take with her to a deserted island are back pack, dry snacks, towels, deodorant, tissue, flashlight/batteries, etc.  When placed in a group with peers, pt was  engaged and interacted well with then in order to complete their list.  Pt listened to the suggestions of peers and collaborated on completing their list.      Plan: Continue to engage patient in RT group sessions 2-3x/week.   Auset Fritzler-McCall, LRT,CTRS 06/24/2022 1:18 PM

## 2022-06-24 NOTE — Tx Team (Signed)
Initial Treatment Plan 06/24/2022 1:51 AM Felicia Griffin [id number]    PATIENT STRESSORS: Other: Insomnia     PATIENT STRENGTHS: Ability for insight  Active sense of humor  Average or above average intelligence  Capable of independent living  Communication skills  General fund of knowledge  Motivation for treatment/growth  Physical Health    PATIENT IDENTIFIED PROBLEMS: Stress management  Time management                   DISCHARGE CRITERIA:  Improved stabilization in mood, thinking, and/or behavior Verbal commitment to aftercare and medication compliance  PRELIMINARY DISCHARGE PLAN: Outpatient therapy Return to previous living arrangement  PATIENT/FAMILY INVOLVEMENT: This treatment plan has been presented to and reviewed with the patient, Felicia Griffin.  The patient has been given the opportunity to ask questions and make suggestions.  Lance Bosch, RN 06/24/2022, 1:51 AM

## 2022-06-24 NOTE — BH IP Treatment Plan (Signed)
Interdisciplinary Treatment and Diagnostic Plan Update  06/24/2022 Time of Session: 10:55 AM  Felicia Griffin MRN: [id number]  Principal Diagnosis: Psychosis [facility])  Secondary Diagnoses: Principal Problem:   Psychosis ([place])   Current Medications:  Current Facility-Administered Medications  Medication Dose Route Frequency Provider Last Rate Last Admin   acetaminophen (TYLENOL) tablet 650 mg  650 mg Oral Q4H PRN Vesta Mixer, NP       alum & mag hydroxide-simeth (MAALOX/MYLANTA) 200-200-20 MG/5ML suspension 30 mL  30 mL Oral Q4H PRN Vesta Mixer, NP       hydrOXYzine (ATARAX) tablet 25 mg  25 mg Oral TID PRN Vesta Mixer, NP       risperiDONE (RISPERDAL M-TABS) disintegrating tablet 2 mg  2 mg Oral Q8H PRN Vesta Mixer, NP       And   LORazepam (ATIVAN) tablet 1 mg  1 mg Oral Q6H PRN Vesta Mixer, NP       And   ziprasidone (GEODON) injection 20 mg  20 mg Intramuscular Q12H PRN Vesta Mixer, NP       nicotine (NICODERM CQ - dosed in mg/24 hours) patch 21 mg  21 mg Transdermal Daily Vesta Mixer, NP       QUEtiapine (SEROQUEL XR) 24 hr tablet 50 mg  50 mg Oral QHS Vesta Mixer, NP       PTA Medications: Medications Prior to Admission  Medication Sig Dispense Refill Last Dose   acetaminophen (TYLENOL) 325 MG tablet Take 2 tablets (650 mg total) by mouth every 6 (six) hours as needed (for pain scale < 4). 30 tablet 0    Blood Pressure KIT Monitor BP readings at home regularly O09.90 Large (Patient not taking: Reported on 07/15/2019) 1 kit 0    Blood Pressure Monitor KIT 1 Device by Does not apply route once a week. To be monitored Regularly at home. (Patient not taking: Reported on 07/15/2019) 1 kit 0    escitalopram (LEXAPRO) 10 MG tablet Take 10 mg by mouth daily.       Patient Stressors: Other: Insomnia    Patient Strengths: Ability for insight  Active sense of humor  Average or above average intelligence  Capable of independent living  Communication  skills  General fund of knowledge  Motivation for treatment/growth  Physical Health   Treatment Modalities: Medication Management, Group therapy, Case management,  1 to 1 session with clinician, Psychoeducation, Recreational therapy.   Physician Treatment Plan for Primary Diagnosis: Psychosis ([place]) Long Term Goal(s):     Short Term Goals:    Medication Management: Evaluate patient's response, side effects, and tolerance of medication regimen.  Therapeutic Interventions: 1 to 1 sessions, Unit Group sessions and Medication administration.  Evaluation of Outcomes: Not Met  Physician Treatment Plan for Secondary Diagnosis: Principal Problem:   Psychosis ([place])  Long Term Goal(s):     Short Term Goals:       Medication Management: Evaluate patient's response, side effects, and tolerance of medication regimen.  Therapeutic Interventions: 1 to 1 sessions, Unit Group sessions and Medication administration.  Evaluation of Outcomes: Not Met   RN Treatment Plan for Primary Diagnosis: Psychosis ([place]) Long Term Goal(s): Knowledge of disease and therapeutic regimen to maintain health will improve  Short Term Goals: Ability to remain free from injury will improve, Ability to verbalize frustration and anger appropriately will improve, Ability to demonstrate self-control, Ability to participate in decision making will improve, Ability to verbalize feelings will improve, Ability to disclose and discuss suicidal ideas, Ability to  identify and develop effective coping behaviors will improve, and Compliance with prescribed medications will improve  Medication Management: RN will administer medications as ordered by provider, will assess and evaluate patient's response and provide education to patient for prescribed medication. RN will report any adverse and/or side effects to prescribing provider.  Therapeutic Interventions: 1 on 1 counseling sessions, Psychoeducation, Medication administration,  Evaluate responses to treatment, Monitor vital signs and CBGs as ordered, Perform/monitor CIWA, COWS, AIMS and Fall Risk screenings as ordered, Perform wound care treatments as ordered.  Evaluation of Outcomes: Not Met   LCSW Treatment Plan for Primary Diagnosis: Psychosis ([place]) Long Term Goal(s): Safe transition to appropriate next level of care at discharge, Engage patient in therapeutic group addressing interpersonal concerns.  Short Term Goals: Engage patient in aftercare planning with referrals and resources, Increase social support, Increase ability to appropriately verbalize feelings, Increase emotional regulation, Facilitate acceptance of mental health diagnosis and concerns, Facilitate patient progression through stages of change regarding substance use diagnoses and concerns, Identify triggers associated with mental health/substance abuse issues, and Increase skills for wellness and recovery  Therapeutic Interventions: Assess for all discharge needs, 1 to 1 time with Social worker, Explore available resources and support systems, Assess for adequacy in community support network, Educate family and significant other(s) on suicide prevention, Complete Psychosocial Assessment, Interpersonal group therapy.  Evaluation of Outcomes: Not Met   Progress in Treatment: Attending groups: No. Participating in groups: No. Taking medication as prescribed: Yes. Toleration medication: Yes. Family/Significant other contact made: No, will contact:   Felicia Griffin (daughter) -[phone]                   or Felicia Griffin (mother in law) [phone] Patient understands diagnosis: Yes. Discussing patient identified problems/goals with staff: Yes. Medical problems stabilized or resolved: Yes. Denies suicidal/homicidal ideation: Yes. Issues/concerns per patient self-inventory: No.  New problem(s) identified: No, Describe:  None Reported   New Short Term/Long Term Goal(s): medication stabilization,  elimination of SI thoughts, development of comprehensive mental wellness plan.    Patient Goals:  " I need to go home to be with my children, especially my special need 44 year old. I am already connected with top priority for therapy, my daughter is supportive , and I need medication stabilizer"   Discharge Plan or Barriers: Patient recently admitted. CSW will continue to follow and assess for appropriate referrals and possible discharge planning.    Reason for Continuation of Hospitalization: Anxiety Delusions  Depression Medication stabilization  Estimated Length of Stay: 5-7 days   Last 3 [place] Suicide Severity Risk Score: [place] Admission (Current) from 06/24/2022 in [facility] ED from 06/02/2022 in [facility] Emergency Department at [place] No Risk No Risk       Last PHQ 2/9 Scores:     No data to display          Scribe for Treatment Team: Charlett Lango 06/24/2022 12:09 PM

## 2022-06-24 NOTE — Progress Notes (Signed)
Patient ID: Barbera Hinze, female   DOB: April 08, 1981, 42 y.o.   MRN: [id number] D- Patient alert and oriented. Denies SI, HI, AVH. Patient calm and cooperative upon assessment. Patient reports poor sleep recently. Patient was brought to [facility] by her daughter for altered mental status and bizarre speech. While in the ED, patient became combative and was given IM medications and placed in restraints. Per ED nurse, patient slept through the night and woke up alert and oriented and cooperative. Skin assessment performed. IV infiltration noted on the left forearm. No contraband found. Patient lives at home with her 5 children, the oldest being an adult who she states is a support for her.   A- Patient oriented to unit. Food and beverage offered and accepted. Support and encouragement provided.  Routine safety checks conducted every 15 minutes.  Patient informed to notify staff with problems or concerns.  R- Patient contracts for safety at this time. Patient receptive, calm, and cooperative. Patient remains safe at this time.

## 2022-06-24 NOTE — Progress Notes (Signed)
   06/24/22 1100  Psych Admission Type (Psych Patients Only)  Admission Status Involuntary  Psychosocial Assessment  Patient Complaints Insomnia  Eye Contact Fair  Facial Expression Animated  Affect Appropriate to circumstance  Speech Logical/coherent  Interaction Assertive  Motor Activity Other (Comment)  Appearance/Hygiene In scrubs  Behavior Characteristics Cooperative;Appropriate to situation  Mood Pleasant  Thought Process  Coherency WDL  Content WDL  Delusions None reported or observed  Perception WDL  Hallucination None reported or observed  Judgment WDL  Confusion None  Danger to Self  Current suicidal ideation? Denies  Agreement Not to Harm Self Yes  Description of Agreement Verbal  Danger to Others  Danger to Others None reported or observed
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
## 2022-06-24 NOTE — Progress Notes (Signed)
The patient rated her day as a 10 out of 10 since she had a calm day. Her positive event for the day is that she went outside.
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
## 2022-06-24 NOTE — Progress Notes (Signed)
   06/24/22 0556  15 Minute Checks  Location Bedroom  Visual Appearance Calm  Behavior Composed  Sleep (Behavioral Health Patients Only)  Calculate sleep? (Click Yes once per 24 hr at 0600 safety check) Yes  Documented sleep last 24 hours 3.25

## 2022-06-25 LAB — BASIC METABOLIC PANEL
Anion gap: 9 (ref 5–15)
BUN: 15 mg/dL (ref 6–20)
CO2: 27 mmol/L (ref 22–32)
Calcium: 8.8 mg/dL — ABNORMAL LOW (ref 8.9–10.3)
Chloride: 102 mmol/L (ref 98–111)
Creatinine, Ser: 0.83 mg/dL (ref 0.44–1.00)
GFR, Estimated: >60 mL/min (ref >60)
Glucose, Bld: 113 mg/dL — ABNORMAL HIGH (ref 70–99)
Potassium: 3.6 mmol/L (ref 3.5–5.1)
Sodium: 138 mmol/L (ref 135–145)

## 2022-06-25 LAB — LIPID PANEL
Cholesterol: 136 mg/dL (ref 0–200)
HDL: 66 mg/dL (ref >40)
LDL Cholesterol: 62 mg/dL (ref 0–99)
Total CHOL/HDL Ratio: 2.1 RATIO
Triglycerides: 42 mg/dL (ref <150)
VLDL: 8 mg/dL (ref 0–40)

## 2022-06-25 LAB — VITAMIN D 25 HYDROXY (VIT D DEFICIENCY, FRACTURES): Vit D, 25-Hydroxy: 22.41 ng/mL — ABNORMAL LOW (ref 30–100)

## 2022-06-25 LAB — HEMOGLOBIN A1C
Hgb A1c MFr Bld: 5 % (ref 4.8–5.6)
Mean Plasma Glucose: 96.8 mg/dL

## 2022-06-25 LAB — TSH: TSH: 0.786 u[IU]/mL (ref 0.350–4.500)

## 2022-06-25 LAB — VITAMIN B12: Vitamin B-12: 212 pg/mL (ref 180–914)

## 2022-06-25 MED ORDER — VITAMIN D (ERGOCALCIFEROL) 1.25 MG (50000 UNIT) PO CAPS
50000.0000 [IU] | ORAL_CAPSULE | ORAL | Status: DC
  Filled 2022-06-25: qty 1

## 2022-06-25 MED ORDER — POLYVINYL ALCOHOL 1.4 % OP SOLN
1.0000 [drp] | OPHTHALMIC | Status: DC | PRN
  Administered 2022-06-26: 1 [drp] via OPHTHALMIC
  Filled 2022-06-25: qty 15

## 2022-06-25 MED ORDER — CYANOCOBALAMIN 1000 MCG/ML IJ SOLN
1000.0000 ug | INTRAMUSCULAR | Status: DC
  Filled 2022-06-25: qty 1

## 2022-06-25 MED ORDER — VITAMIN D 25 MCG (1000 UNIT) PO TABS
1000.0000 [IU] | ORAL_TABLET | Freq: Every day | ORAL | Status: DC
  Administered 2022-06-25 – 2022-06-26 (×2): 1000 [IU] via ORAL
  Filled 2022-06-25 (×3): qty 1

## 2022-06-25 MED ORDER — SODIUM CHLORIDE 0.9 % IN NEBU
INHALATION_SOLUTION | RESPIRATORY_TRACT | Status: AC
  Filled 2022-06-25: qty 3

## 2022-06-25 NOTE — [facility] Group Notes (Signed)
Adult Psychoeducational Group Note  Date:  06/25/2022 Time:  12:32 PM  Group Topic/Focus:  Goals Group:   The focus of this group is to help patients establish daily goals to achieve during treatment and discuss how the patient can incorporate goal setting into their daily lives to aide in recovery.  Participation Level:  Active  Participation Quality:  Appropriate  Affect:  Appropriate  Cognitive:  Appropriate  Insight: Appropriate  Engagement in Group:  Engaged  Modes of Intervention:  Education  Additional Comments:  Pt goal today is to stay focus on one task at a time and not to let the noise of the outside distract her thoughts. Pt has no feelings of wanting to hurt herself or others.  Felicia Griffin, Georgiann Mccoy 06/25/2022, 12:32 PM

## 2022-06-25 NOTE — Progress Notes (Signed)
   06/25/22 1100  Charting Type  Charting Type Shift assessment  Safety Check Verification  Has the RN verified the 15 minute safety check completion? Yes  Neurological  Neuro (WDL) WDL  HEENT  HEENT (WDL) X (No changes)  Respiratory  Respiratory (WDL) WDL  Cardiac  Cardiac (WDL) WDL  Vascular  Vascular (WDL) WDL  Integumentary  Integumentary (WDL) X (No changes)  Braden Scale (Ages 8 and up)  Sensory Perceptions 4  Moisture 4  Activity 4  Mobility 4  Nutrition 3  Friction and Shear 3  Braden Scale Score 22  Musculoskeletal  Musculoskeletal (WDL) WDL  Assistive Device None  Gastrointestinal  Gastrointestinal (WDL) WDL  GU Assessment  Genitourinary (WDL) WDL  Neurological  Level of Consciousness Alert

## 2022-06-25 NOTE — Progress Notes (Signed)
Adult Psychoeducational Group Note  Date:  06/25/2022 Time:  10:30 PM  Group Topic/Focus:  Wrap-Up Group:   The focus of this group is to help patients review their daily goal of treatment and discuss progress on daily workbooks.  Participation Level:  Active  Participation Quality:  Appropriate  Affect:  Appropriate  Cognitive:  Appropriate  Insight: Appropriate  Engagement in Group:  Engaged  Modes of Intervention:  Education and Exploration  Additional Comments:  Patient attended and participated in group tonight. Patient reports that she learn not to judge people  Debe Coder 06/25/2022, 10:30 PM

## 2022-06-25 NOTE — Plan of Care (Signed)
  Problem: Education: Goal: Knowledge of [place] [facility] Education information/materials will improve Outcome: Progressing Goal: Mental status will improve Outcome: Progressing   Problem: Activity: Goal: Sleeping patterns will improve Outcome: Progressing   Problem: Safety: Goal: Periods of time without injury will increase Outcome: Progressing   Problem: Coping: Goal: Coping ability will improve Outcome: Progressing   Problem: Role Relationship: Goal: Ability to interact with others will improve Outcome: Progressing   Problem: Self-Concept: Goal: Will verbalize positive feelings about self Outcome: Progressing

## 2022-06-25 NOTE — Group Note (Signed)
LCSW Group Therapy Note   Group Date: 06/25/2022 Start Time: 1100 End Time: 1200  Type of Group and Topic: Psychoeducational Group: Discharge Planning  Participation Level: Active  Description of Group Discharge planning group reviews patient's anticipated discharge plans and assists patients to anticipate and address any barriers to wellness/recovery in the community. Suicide prevention education is reviewed with patients in group. Patients will receive a worksheet of the 5W's (Who, What, When, Where, How).  The Pt will discuss supports, coping skills, and their motivations for change.   Therapeutic Goals  1. Patients will state their anticipated discharge plan and mental health aftercare  2. Patients will identify potential barriers to wellness in the community setting  3. Patients will engage in problem solving, solution focused discussion of ways to anticipate and address barriers to wellness/recovery  Summary of Patient Progress: The Pt attended group and remained there the entire time. The Pt accepted all worksheets and materials that were provided.  The Pt expressed understanding of the topic being discussed by asking questions and sharing their thoughts and feelings with other group members.  The Pt was appropriate with their peers and staff.   Darleen Crocker, LCSWA 06/25/2022  1:26 PM

## 2022-06-25 NOTE — Progress Notes (Signed)
   06/24/22 2130  Psych Admission Type (Psych Patients Only)  Admission Status Involuntary  Psychosocial Assessment  Patient Complaints None  Eye Contact Fair  Facial Expression Animated  Affect Appropriate to circumstance  Speech Logical/coherent  Interaction Assertive  Motor Activity Other (Comment)  Appearance/Hygiene In scrubs  Behavior Characteristics Cooperative;Appropriate to situation  Mood Pleasant  Thought Process  Coherency WDL  Content WDL  Delusions None reported or observed  Perception WDL  Hallucination None reported or observed  Judgment WDL  Confusion None  Danger to Self  Current suicidal ideation? Denies  Agreement Not to Harm Self Yes  Description of Agreement Verbal  Danger to Others  Danger to Others None reported or observed

## 2022-06-25 NOTE — Progress Notes (Cosign Needed Addendum)
[facility] MD Progress Note  06/25/2022 4:38 PM [name]  MRN:  [id number] Principal Problem: Bipolar I disorder, current or most recent episode manic, with psychotic features ([place]) Diagnosis: Principal Problem:   Bipolar I disorder, current or most recent episode manic, with psychotic features ([place]) Active Problems:   Anxiety state   Insomnia   Tetrahydrocannabinol (THC) use disorder, mild, abuse   Nicotine dependence  Reason For Admission: Felicia Griffin is a 42 yo [place] American female with a prior mental health diagnosis of bipolar 1 d/o who was taken to the [facility] ER by EMS on 06/22/22 after her daughter called emergency services to report that "she wasn't acting right". When emergency services responded, pt was found to be confused & combative, requiring Versed 5 mg prior to transportation to the ER, and restraints and Geodon once in the ER d/t agitation. Pt was subsequently stabilized, medically cleared prior to being transported involuntarily to this [facility] for treatment and stabilization of her mood.   24 hour chart review: Vital signs within normal limits for the past 24 hours, patient is compliant with scheduled medications, she is visible in group sessions.  Sleep hours last night are documented as 6.25 as per nursing flow sheets.  No behavioral issues documented in the past 24 hours.  No antianxiety or agitation protocol medications given in the past 24 hours.  Patient assessment note: Pt's mood is less depressed as compared to yesterday, she is oriented to person, place, time and situation. Her  attention to personal hygiene and grooming is fair, eye contact is good, speech is clear & coherent. Thought contents are organized and logical, and pt currently denies SI/HI/AVH or paranoia. There is no evidence of delusional thoughts.    Patient however, presents with extremely poor insight regarding the need for additional workup regarding her CT scan findings that were completed prior  to transfer to this behavioral health Hospital.  As per those results, patient presented with a vague hypodense region in the posterior aspect of her skull requiring an MRI, and was agitated at the time rendering an MRI impossible.  Writer and attending psychiatrist contacted neurology today, and both spoke with Dr. Curly Shores from Neuro, who is agreeable for patient to be transferred to the [facility] or [facility] for completion of an MRI.  Both Probation officer and attending psychiatrist educated patient on the importance of having the MRI completed while hospitalized here at this hospital, but patient is extremely resistant at this time, has refused to have MRI completed during this hospitalization, and has insisted that she will have it completed outpatient.  She has insisted to be discharged, stating that she will have her PCP follow her up after discharge.  Patient has been educated on the negative impact of not having the MRI completed, including delay and treatments in the event that the findings are indicated if of her disease process.  She has verbalized understanding, but remains adamant that she does not want the MRI completed while hospitalized here at this hospital.  Orders to have MRI completed have been discontinued.  Patient reports a good sleep quality last night, reports a good appetite, denies medication related side effects, denies being in any physical pain or distress, and verbalizes readiness for discharge.  We are continuing medications as listed below, and will plan to discharge patient tomorrow per her request.  Total Time spent with patient: 45 minutes  Past Psychiatric History: Bipolar d/o, PTSD  Past Medical History:  Past Medical  History:  Diagnosis Date   Medical history non-contributory     Past Surgical History:  Procedure Laterality Date   TUBAL LIGATION N/A 06/03/2019   Procedure: POST PARTUM TUBAL LIGATION;  Surgeon: Aletha Halim, MD;  Location: [facility] LD  ORS;  Service: Gynecology;  Laterality: N/A;   TUBAL LIGATION     Family History:  Family History  Problem Relation Age of Onset   Breast cancer Sister    Family Psychiatric  History: See H & P Social History:  Social History   Substance and Sexual Activity  Alcohol Use Yes   Alcohol/week: 2.0 standard drinks of alcohol   Types: 2 Glasses of wine per week     Social History   Substance and Sexual Activity  Drug Use Yes   Types: Marijuana    Social History   Socioeconomic History   Marital status: Single    Spouse name: Not on file   Number of children: Not on file   Years of education: Not on file   Highest education level: Not on file  Occupational History   Not on file  Tobacco Use   Smoking status: Every Day    Packs/day: 0.25    Types: Cigarettes    Last attempt to quit: 01/05/2017    Years since quitting: 5.4   Smokeless tobacco: Never  Vaping Use   Vaping Use: Never used  Substance and Sexual Activity   Alcohol use: Yes    Alcohol/week: 2.0 standard drinks of alcohol    Types: 2 Glasses of wine per week   Drug use: Yes    Types: Marijuana   Sexual activity: Not Currently    Partners: Male  Other Topics Concern   Not on file  Social History Narrative   Not on file   Social Determinants of Health   Financial Resource Strain: Not on file  Food Insecurity: No Food Insecurity (06/24/2022)   Hunger Vital Sign    Worried About Running Out of Food in the Last Year: Never true    Ran Out of Food in the Last Year: Never true  Transportation Needs: No Transportation Needs (06/24/2022)   PRAPARE - Hydrologist (Medical): No    Lack of Transportation (Non-Medical): No  Physical Activity: Not on file  Stress: Not on file  Social Connections: Not on file  Sleep: Good  Appetite:  Good  Current Medications: Current Facility-Administered Medications  Medication Dose Route Frequency Provider Last Rate Last Admin   acetaminophen  (TYLENOL) tablet 650 mg  650 mg Oral Q4H PRN Vesta Mixer, NP       alum & mag hydroxide-simeth (MAALOX/MYLANTA) 200-200-20 MG/5ML suspension 30 mL  30 mL Oral Q4H PRN Vesta Mixer, NP       cholecalciferol (VITAMIN D3) 25 MCG (1000 UNIT) tablet 1,000 Units  1,000 Units Oral Daily Massengill, Ovid Curd, MD   1,000 Units at 06/25/22 1553   cyanocobalamin (VITAMIN B12) injection 1,000 mcg  1,000 mcg Intramuscular Q30 days Massengill, Ovid Curd, MD       hydrOXYzine (ATARAX) tablet 25 mg  25 mg Oral TID PRN Vesta Mixer, NP       risperiDONE (RISPERDAL M-TABS) disintegrating tablet 2 mg  2 mg Oral Q8H PRN Vesta Mixer, NP       And   LORazepam (ATIVAN) tablet 1 mg  1 mg Oral Q6H PRN Vesta Mixer, NP       And   ziprasidone (GEODON) injection 20 mg  20  mg Intramuscular Q12H PRN Vesta Mixer, NP       nicotine (NICODERM CQ - dosed in mg/24 hours) patch 21 mg  21 mg Transdermal Daily Vesta Mixer, NP       polyvinyl alcohol (LIQUIFILM TEARS) 1.4 % ophthalmic solution 1 drop  1 drop Both Eyes PRN Nicholes Rough, NP       QUEtiapine (SEROQUEL XR) 24 hr tablet 100 mg  100 mg Oral QHS Meggan Dhaliwal, NP   100 mg at 06/24/22 2122   sodium chloride 0.9 % nebulizer solution             Lab Results:  Results for orders placed or performed during the hospital encounter of 06/24/22 (from the past 48 hour(s))  Lipid panel     Status: None   Collection Time: 06/25/22  6:41 AM  Result Value Ref Range   Cholesterol 136 0 - 200 mg/dL   Triglycerides 42 <150 mg/dL   HDL 66 >40 mg/dL   Total CHOL/HDL Ratio 2.1 RATIO   VLDL 8 0 - 40 mg/dL   LDL Cholesterol 62 0 - 99 mg/dL    Comment:        Total Cholesterol/HDL:CHD Risk Coronary Heart Disease Risk Table                     Men   Women  1/2 Average Risk   3.4   3.3  Average Risk       5.0   4.4  2 X Average Risk   9.6   7.1  3 X Average Risk  23.4   11.0        Use the calculated Patient Ratio above and the CHD Risk Table to  determine the patient's CHD Risk.        ATP III CLASSIFICATION (LDL):  <100     mg/dL   Optimal  100-129  mg/dL   Near or Above                    Optimal  130-159  mg/dL   Borderline  160-189  mg/dL   High  >190     mg/dL   Very High Performed at [facility] [address] [place], [place] [address]   Hemoglobin A1c     Status: None   Collection Time: 06/25/22  6:41 AM  Result Value Ref Range   Hgb A1c MFr Bld 5.0 4.8 - 5.6 %    Comment: (NOTE) Pre diabetes:          5.7%-6.4%  Diabetes:              >6.4%  Glycemic control for   <7.0% adults with diabetes    Mean Plasma Glucose 96.8 mg/dL    Comment: Performed at [facility] [address] [place], [place] [address]  TSH     Status: None   Collection Time: 06/25/22  6:41 AM  Result Value Ref Range   TSH 0.786 0.350 - 4.500 uIU/mL    Comment: Performed by a 3rd Generation assay with a functional sensitivity of <=0.01 uIU/mL. Performed at [facility], [place] [address] [place],  [address]   Vitamin B12     Status: None   Collection Time: 06/25/22  6:41 AM  Result Value Ref Range   Vitamin B-12 212 180 - 914 pg/mL    Comment: (NOTE) This assay is not validated for testing neonatal or myeloproliferative  syndrome specimens for Vitamin B12 levels. Performed at [facility], [place] [address] [place], [place] [address]   VITAMIN D 25 Hydroxy (Vit-D Deficiency, Fractures)     Status: Abnormal   Collection Time: 06/25/22  6:41 AM  Result Value Ref Range   Vit D, 25-Hydroxy 22.41 (L) 30 - 100 ng/mL    Comment: (NOTE) Vitamin D deficiency has been defined by the [place] practice guideline as a level of serum 25-OH  vitamin D less than 20 ng/mL (1,2). The Endocrine Society went on to  further define vitamin D insufficiency as a level between 21 and 29  ng/mL (2).  1. IOM (Institute of Medicine). 2010. Dietary  reference intakes for  calcium and D. [place]: The [employer]. 2. Holick MF, Binkley [place], Bischoff-Ferrari HA, et al. Evaluation,  treatment, and prevention of vitamin D deficiency: an Endocrine  Society clinical practice guideline, JCEM. 2011 Jul; 96(7): 1911-30.  Performed at [facility], [place] [address], [place], [place] [id number]   Basic metabolic panel     Status: Abnormal   Collection Time: 06/25/22  6:41 AM  Result Value Ref Range   Sodium 138 135 - 145 mmol/L   Potassium 3.6 3.5 - 5.1 mmol/L   Chloride 102 98 - 111 mmol/L   CO2 27 22 - 32 mmol/L   Glucose, Bld 113 (H) 70 - 99 mg/dL    Comment: Glucose reference range applies only to samples taken after fasting for at least 8 hours.   BUN 15 6 - 20 mg/dL   Creatinine, Ser 0.83 0.44 - 1.00 mg/dL   Calcium 8.8 (L) 8.9 - 10.3 mg/dL   GFR, Estimated >60 >60 mL/min    Comment: (NOTE) Calculated using the CKD-EPI Creatinine Equation (2021)    Anion gap 9 5 - 15    Comment: Performed at [facility], [place] [address], [place], [place] [address]    Blood Alcohol level:  Lab Results  Component Value Date   ETH <10 123XX123    Metabolic Disorder Labs: Lab Results  Component Value Date   HGBA1C 5.0 06/25/2022   MPG 96.8 06/25/2022   No results found for: "PROLACTIN" Lab Results  Component Value Date   CHOL 136 06/25/2022   TRIG 42 06/25/2022   HDL 66 06/25/2022   CHOLHDL 2.1 06/25/2022   VLDL 8 06/25/2022   LDLCALC 62 06/25/2022    Physical Findings: AIMS: 0 CIWA:    COWS:     Musculoskeletal: Strength & Muscle Tone: within normal limits Gait & Station: normal Patient leans: N/A  Psychiatric Specialty Exam:  Presentation  General Appearance:  Appropriate for Environment; Fairly Groomed  Eye Contact: Good  Speech: Clear and Coherent  Speech Volume: Normal  Handedness: Right   Mood and Affect  Mood: Depressed  Affect: Congruent   Thought  Process  Thought Processes: Coherent  Descriptions of Associations:Intact  Orientation:Full (Time, Place and Person)  Thought Content:Logical  History of Schizophrenia/Schizoaffective disorder:No data recorded Duration of Psychotic Symptoms:No data recorded Hallucinations:Hallucinations: None  Ideas of Reference:None  Suicidal Thoughts:Suicidal Thoughts: No  Homicidal Thoughts:Homicidal Thoughts: No   Sensorium  Memory: Immediate Good  Judgment: Poor  Insight: Poor   Executive Functions  Concentration: Fair  Attention Span: Fair  Recall: [place] of Knowledge: Fair  Language: Fair   Psychomotor Activity  Psychomotor Activity: Psychomotor Activity: Normal   Assets  Assets: Communication Skills  Sleep  Sleep: Sleep: Good    Physical Exam: Physical Exam Constitutional:      Appearance: Normal appearance.  HENT:     Head: Normocephalic.  Eyes:     Pupils: Pupils are equal, round, and reactive to light.  Pulmonary:     Effort: Pulmonary effort is normal. No respiratory distress.  Musculoskeletal:     Cervical back: Normal range of motion.  Neurological:     Mental Status: She is alert and oriented to person, place, and time.    Review of Systems  Constitutional: Negative.   HENT: Negative.    Eyes: Negative.   Respiratory: Negative.    Cardiovascular: Negative.   Gastrointestinal: Negative.   Genitourinary: Negative.   Musculoskeletal:  Negative for myalgias.  Skin: Negative.   Neurological: Negative.   Psychiatric/Behavioral:  Positive for depression and substance abuse. Negative for hallucinations, memory loss and suicidal ideas. The patient is nervous/anxious. The patient does not have insomnia.    Blood pressure 115/68, pulse 85, temperature 97.6 F (36.4 C), temperature source Oral, resp. rate 16, height 5' 8"$  (1.727 m), weight 65.8 kg, SpO2 100 %, unknown if currently breastfeeding. Body mass index is 22.05  kg/m.   Treatment Plan Summary: Treatment Plan Summary: Daily contact with patient to assess and evaluate symptoms and progress in treatment and Medication management   Observation Level/Precautions:  15 minute checks  Laboratory:  Labs reviewed   Psychotherapy:  Unit Group sessions  Medications:  See [facility]  Consultations:  To be determined   Discharge Concerns:  Safety, medication compliance, mood stability  Estimated LOS: 5-7 days  Other:  N/A    Labs reviewed on 06/25/2022: Respiratory panel negative UDS positive for THC, positive for benzos.  CBC with WBC slightly elevated at 11.9.  EtOH less than 10.  CMP with K of 3.3, 40 mEq was given in the ER.  Repeated BMP with K normal. vitamin B1 pending, B12 WNL, but will supplement as per neuro. vitamin D low at 22.41, will supplement. lipid panel, hemoglobin A1c and TSH WNL. CT scan completed in the ER shows "vague hypodense region in the posterior aspect". MRI recommended, pt has refused.   PLAN Safety and Monitoring: Voluntary admission to inpatient psychiatric unit for safety, stabilization and treatment Daily contact with patient to assess and evaluate symptoms and progress in treatment Patient's case to be discussed in multi-disciplinary team meeting Observation Level : q15 minute checks Vital signs: q12 hours Precautions: Safety     Long Term Goal(s): Improvement in symptoms so as ready for discharge   Short Term Goals: Ability to identify changes in lifestyle to reduce recurrence of condition will improve, Ability to verbalize feelings will improve, Ability to demonstrate self-control will improve, Ability to identify and develop effective coping behaviors will improve, Compliance with prescribed medications will improve, and Ability to identify triggers associated with substance abuse/mental health issues will improve   Diagnoses:  Principal Problem:   Bipolar I disorder, current or most recent episode manic, with psychotic  features ([place]) Active Problems:   Anxiety state   Insomnia   Tetrahydrocannabinol (THC) use disorder, mild, abuse   Nicotine dependence   Medications -Start Vitamin D 1000 units daily for Vit D deficiency -Start Vitamin B12 1000 units daily per neuro recommendations -Continue Seroquel XR 100 mg nightly for mood stabilization -Continue hydroxyzine 25 mg 3 times daily as needed for anxiety -Continue Risperdal/Ativan/Geodon for agitation-please see the MAR for complete order -Continue nicotine patch for nicotine dependence -Continue  nicotine gum for nicotine dependence as needed   Other PRNS -Continue Tylenol 650 mg every 6 hours PRN for mild pain -Continue Maalox 30 mg every 4 hrs PRN for indigestion -Continue Imodium 2-4 mg as needed for diarrhea -Continue Milk of Magnesia as needed every 6 hrs for constipation   Discharge Planning: Social work and case management to assist with discharge planning and identification of hospital follow-up needs prior to discharge Estimated LOS: 5-7 days Discharge Concerns: Need to establish a safety plan; Medication compliance and effectiveness Discharge Goals: Return home with outpatient referrals for mental health follow-up including medication management/psychotherapy   I certify that inpatient services furnished can reasonably be expected to improve the patient's condition.      Nicholes Rough, NP 06/25/2022, 4:38 PM

## 2022-06-25 NOTE — Progress Notes (Signed)
   06/25/22 0629  15 Minute Checks  Location Hallway  Visual Appearance Calm  Behavior Composed  Sleep (Behavioral Health Patients Only)  Calculate sleep? (Click Yes once per 24 hr at 0600 safety check) Yes  Documented sleep last 24 hours 6.25

## 2022-06-25 NOTE — Group Note (Signed)
Recreation Therapy Group Note   Group Topic:Animal Assisted Therapy   Group Date: 06/25/2022 Start Time: 1430 End Time: 1500 Facilitators: Delcia Spitzley-McCall, LRT,CTRS Location: 300 Hall Dayroom   Animal-Assisted Activity (AAA) Program Checklist/Progress Notes Patient Eligibility Criteria Checklist & Daily Group note for Rec Tx Intervention  AAA/T Program Assumption of Risk Form signed by Patient/ or Parent Legal Guardian Yes  Patient is free of allergies or severe asthma Yes  Patient reports no fear of animals Yes  Patient reports no history of cruelty to animals Yes  Patient understands his/her participation is voluntary Yes  Patient washes hands before animal contact Yes  Patient washes hands after animal contact Yes   Affect/Mood: Appropriate   Participation Level: Engaged   Participation Quality: Independent   Behavior: Appropriate    Clinical Observations/Individualized Feedback:  Patient attended session and interacted appropriately with therapy dog and peers. Patient asked appropriate questions about therapy dog and his training. Patient shared stories about their pets at home with group.    Plan: Continue to engage patient in RT group sessions 2-3x/week.   Felicia Griffin, LRT,CTRS 06/25/2022 4:28 PM

## 2022-06-25 NOTE — Plan of Care (Signed)
  Problem: Education: Goal: Emotional status will improve Outcome: Progressing Goal: Mental status will improve Outcome: Progressing   

## 2022-06-26 DIAGNOSIS — F312 Bipolar disorder, current episode manic severe with psychotic features: Principal | ICD-10-CM

## 2022-06-26 MED ORDER — CYANOCOBALAMIN 1000 MCG/ML IJ SOLN: 1000.0000 ug | INTRAMUSCULAR | 0 refills | Status: AC

## 2022-06-26 MED ORDER — POLYVINYL ALCOHOL 1.4 % OP SOLN: 1.0000 [drp] | OPHTHALMIC | 0 refills | Status: AC | PRN

## 2022-06-26 MED ORDER — VITAMIN D 25 MCG (1000 UNIT) PO TABS: 1000.0000 [IU] | ORAL_TABLET | Freq: Every day | ORAL | Status: AC

## 2022-06-26 MED ORDER — NICOTINE 21 MG/24HR TD PT24: 21.0000 mg | MEDICATED_PATCH | Freq: Every day | TRANSDERMAL | 0 refills | Status: AC

## 2022-06-26 MED ORDER — QUETIAPINE FUMARATE ER 50 MG PO TB24: 100.0000 mg | ORAL_TABLET | Freq: Every day | ORAL | 0 refills | Status: AC

## 2022-06-26 NOTE — Discharge Instructions (Addendum)
-  Follow-up with your outpatient psychiatric provider -instructions on appointment date, time, and address (location) are provided to you in discharge paperwork.  -Take your psychiatric medications as prescribed at discharge - instructions are provided to you in the discharge paperwork  -Follow-up with outpatient primary care doctor and other specialists -for management of preventative medicine and any chronic medical disease. You had an abnormality on your head CT, but you refused to get the recommended MRI (recommended by consulted neurologist) Follow-up with this as outpatient.   -Recommend abstinence from alcohol, tobacco, and other illicit drug use at discharge.   -If your psychiatric symptoms recur, worsen, or if you have side effects to your psychiatric medications, call your outpatient psychiatric provider, [phone], [phone] or go to the nearest emergency department.  -If suicidal thoughts occur, call your outpatient psychiatric provider, 911, [phone] or go to the nearest emergency department.  Naloxone (Narcan) can help reverse an overdose when given to the victim quickly.  [facility] offers free naloxone kits and instructions/training on its use.  Add naloxone to your first aid kit and you can help save a life.   Pick up your free kit at the following locations:   [place]:  [place], [place] [address] [phone]) [facility] [place] [id number] [phone]) [facility] [place]  [place]: [place] [id number] [place] [id number] [phone]) [facility] [place] [address] [phone])

## 2022-06-26 NOTE — Plan of Care (Signed)
  Problem: Education: Goal: Knowledge of [facility] [place] [facility] Education information/materials will improve Outcome: Progressing Goal: Emotional status will improve Outcome: Progressing Goal: Verbalization of understanding the information provided will improve Outcome: Progressing   Problem: Activity: Goal: Interest or engagement in activities will improve Outcome: Progressing Goal: Sleeping patterns will improve Outcome: Progressing   Problem: Coping: Goal: Ability to demonstrate self-control will improve Outcome: Progressing   Problem: Health Behavior/Discharge Planning: Goal: Compliance with treatment plan for underlying cause of condition will improve Outcome: Progressing

## 2022-06-26 NOTE — Progress Notes (Signed)
   06/25/22 2100  Psych Admission Type (Psych Patients Only)  Admission Status Involuntary  Psychosocial Assessment  Patient Complaints Anxiety  Eye Contact Fair  Facial Expression Anxious  Affect Appropriate to circumstance  Speech Logical/coherent  Interaction Assertive  Motor Activity Other (Comment)  Appearance/Hygiene Unremarkable  Behavior Characteristics Cooperative;Appropriate to situation  Mood Anxious;Pleasant  Thought Process  Coherency WDL  Content WDL  Delusions None reported or observed  Perception WDL  Hallucination None reported or observed  Judgment WDL  Confusion None  Danger to Self  Current suicidal ideation? Denies  Agreement Not to Harm Self Yes  Description of Agreement Verbal  Danger to Others  Danger to Others None reported or observed

## 2022-06-26 NOTE — Progress Notes (Signed)
   06/26/22 0600  15 Minute Checks  Location Bedroom  Visual Appearance Calm  Behavior Sleeping  Sleep (Behavioral Health Patients Only)  Calculate sleep? (Click Yes once per 24 hr at 0600 safety check) Yes  Documented sleep last 24 hours 7

## 2022-06-26 NOTE — Progress Notes (Signed)
  [facility] Adult Case Management Discharge Plan :  Will you be returning to the same living situation after discharge:  Yes,  home At discharge, do you have transportation home?: Yes,  daughter is picking patient up Do you have the ability to pay for your medications: Yes,  insurance  Release of information consent forms completed and in the chart;  Patient's signature needed at discharge.  Patient to Follow up at:  Follow-up [facility]. Go on 06/27/2022.   Why: You have a therapy appt with this provider on Thursday, 06/27/2022 at [place] have a med management appt at this provider on 07/15/2022 at 10:30am.  Please call this provider if you need to reschedule. Contact information: [place] [address] [phone]         [facility]. Call.   Why: We attempted to confirm appt dates with this provider, however they do not have a release of information to speak with [facility] and unable to provide dates of appointments.  Please follow up with this provider for ongoing family therapy and follow recommendations by provider. Contact information: [address],  [place], [place] [address] ph: [phone] f: [phone]                Next level of care provider has access to [place] and Suicide Prevention discussed: Yes,  daughter      Has patient been referred to the Quitline?: Patient refused referral  Patient has been referred for addiction treatment: N/A  Uldine Fuster E Sherylann Vangorden, LCSW 06/26/2022, 10:39 AM

## 2022-06-26 NOTE — Progress Notes (Signed)
Patient verbalizes readiness for discharge. All patient belongings returned to patient. Discharge instructions read and discussed with patient (appointments, medications, resources). Patient expressed gratitude for care provided. Patient discharged to lobby at 66 where daughter was waiting.

## 2022-06-26 NOTE — Discharge Summary (Signed)
Physician Discharge Summary Note  Patient:  Felicia Griffin is an 42 y.o., female MRN:  [id number] DOB:  11/12/1980 Patient phone:  [phone] (home)  Patient address:   [address] [place] [address],  Total Time spent with patient: 45 minutes  Date of Admission:  06/24/2022 Date of Discharge: 06/26/2022  Reason for Admission:  Felicia Griffin is a 42 yo [place] American female with a prior mental health diagnosis of bipolar 1 d/o who was taken to the [facility] ER by EMS on 06/22/22 after her daughter called emergency services to report that "she wasn't acting right". When emergency services responded, pt was found to be confused & combative, requiring Versed 5 mg prior to transportation to the ER, and restraints and Geodon once in the ER d/t agitation. Pt was subsequently stabilized, medically cleared prior to being transported involuntarily to this [facility] for treatment and stabilization of her mood.     Principal Problem: Bipolar I disorder, current or most recent episode manic, with psychotic features [facility]) Discharge Diagnoses: Principal Problem:   Bipolar I disorder, current or most recent episode manic, with psychotic features ([place]) Active Problems:   Anxiety state   Insomnia   Tetrahydrocannabinol (THC) use disorder, mild, abuse   Nicotine dependence  Past Psychiatric History: See H & P  Past Medical History:  Past Medical History:  Diagnosis Date   Medical history non-contributory     Past Surgical History:  Procedure Laterality Date   TUBAL LIGATION N/A 06/03/2019   Procedure: POST PARTUM TUBAL LIGATION;  Surgeon: Aletha Halim, MD;  Location: [facility] LD ORS;  Service: Gynecology;  Laterality: N/A;   TUBAL LIGATION     Family History:  Family History  Problem Relation Age of Onset   Breast cancer Sister    Family Psychiatric  History: See H & P Social History:  Social History   Substance and Sexual Activity  Alcohol Use Yes   Alcohol/week: 2.0  standard drinks of alcohol   Types: 2 Glasses of wine per week     Social History   Substance and Sexual Activity  Drug Use Yes   Types: Marijuana    Social History   Socioeconomic History   Marital status: Single    Spouse name: Not on file   Number of children: Not on file   Years of education: Not on file   Highest education level: Not on file  Occupational History   Not on file  Tobacco Use   Smoking status: Every Day    Packs/day: 0.25    Types: Cigarettes    Last attempt to quit: 01/05/2017    Years since quitting: 5.4   Smokeless tobacco: Never  Vaping Use   Vaping Use: Never used  Substance and Sexual Activity   Alcohol use: Yes    Alcohol/week: 2.0 standard drinks of alcohol    Types: 2 Glasses of wine per week   Drug use: Yes    Types: Marijuana   Sexual activity: Not Currently    Partners: Male  Other Topics Concern   Not on file  Social History Narrative   Not on file   Social Determinants of Health   Financial Resource Strain: Not on file  Food Insecurity: No Food Insecurity (06/24/2022)   Hunger Vital Sign    Worried About Running Out of Food in the Last Year: Never true    Ran Out of Food in the Last Year: Never true  Transportation Needs: No Transportation Needs (06/24/2022)  PRAPARE - Hydrologist (Medical): No    Lack of Transportation (Non-Medical): No  Physical Activity: Not on file  Stress: Not on file  Social Connections: Not on file                                    HOSPITAL COURSE During the patient's hospitalization, patient had extensive initial psychiatric evaluation, and follow-up psychiatric evaluations every day. Psychiatric diagnoses provided upon initial assessment are as listed above.  Patient's psychiatric medications were adjusted on admission as below: -Started Seroquel XR 100 mg nightly for mood stabilization -Started hydroxyzine 25 mg 3 times daily as needed for anxiety -Started  Risperdal/Ativan/Geodon for agitation-please see the MAR for complete order -Started nicotine patch for nicotine dependence -Started nicotine gum for nicotine dependence as needed   During the hospitalization, other adjustments were made to the patient's psychiatric medication regimen. Medications at discharge are as follows:   -Continue Vitamin D 1000 units daily for Vit D deficiency -Continue Vitamin B12 1000 units daily per neurology recommendations -Continue Seroquel XR 100 mg nightly for mood stabilization -Continue nicotine patch for nicotine dependence -Continue eye drops for dry eyes as needed   Patient's care was discussed during the interdisciplinary team meeting every day during the hospitalization. The patient denies having side effects to prescribed psychiatric medication. Gradually, patient started adjusting to milieu. The patient was evaluated each day by a clinical provider to ascertain response to treatment. Improvement was noted by the patient's report of decreasing symptoms, improved sleep and appetite, affect, medication tolerance, behavior, and participation in unit programming.  Patient was asked each day to complete a self inventory noting mood, mental status, pain, new symptoms, anxiety and concerns.     Symptoms were reported as significantly decreased or resolved completely by discharge. On day of discharge, the patient reports that their mood is stable. The patient denied having suicidal thoughts for more than 48 hours prior to discharge.  Patient denies having homicidal thoughts.  Patient denies having auditory hallucinations.  Patient denies any visual hallucinations or other symptoms of psychosis. The patient was motivated to continue taking medication with a goal of continued improvement in mental health.    The patient reports their target psychiatric symptoms of psychosis responded well to the psychiatric medications, and the patient reports overall benefit other  psychiatric hospitalization. Supportive psychotherapy was provided to the patient. The patient also participated in regular group therapy while hospitalized. Coping skills, problem solving as well as relaxation therapies were also part of the unit programming.   Labs were reviewed with the patient, and abnormal results were discussed with the patient. Respiratory panel negative, UDS positive for THC, positive for benzos.  CBC with WBC slightly elevated at 11.9.  EtOH less than 10.  CMP with K of 3.3, 40 mEq was given in the ER.  Repeated BMP with K normal. vitamin B1 pending at time of discharge, pt educated to follow this up with her PCP. B12 WNL, but will supplement as per neurology recommendations. vitamin D low at 22.41, will supplement. lipid panel, hemoglobin A1c and TSH WNL. CT scan completed in the ER shows "vague hypodense region in the posterior aspect". MRI recommended by neurology, attending psychiatrist and writer spoke with pt about the importance of complying with the MRI while still hospitalized at this [facility]. Pt has refused, and has stated that she will follow this up  outpatient with her primary care provider.   The patient is able to verbalize their individual safety plan to this provider.   # It is recommended to the patient to continue psychiatric medications as prescribed, after discharge from the hospital.     # It is recommended to the patient to follow up with your outpatient psychiatric provider and PCP.   # It was discussed with the patient, the impact of alcohol, drugs, tobacco have been there overall psychiatric and medical wellbeing, and total abstinence from substance use was recommended the patient.ed.   # Prescriptions provided or sent directly to preferred pharmacy at discharge. Patient agreeable to plan. Given opportunity to ask questions. Appears to feel comfortable with discharge.    # In the event of worsening symptoms, the patient is instructed to call the crisis  hotline, 911 and or go to the nearest ED for appropriate evaluation and treatment of symptoms. To follow-up with primary care provider for other medical issues, concerns and or health care needs   # Patient was discharged home with a plan to follow up as noted below.    Total Time spent with patient: 45 minutes  Physical Findings: AIMS: n/a CIWA:  n/a COWS:  n/a  Musculoskeletal: Strength & Muscle Tone: within normal limits Gait & Station: normal Patient leans: N/A  Psychiatric Specialty Exam:  Presentation  General Appearance:  Appropriate for Environment; Fairly Groomed  Eye Contact: Good  Speech: Clear and Coherent  Speech Volume: Normal  Handedness: Right   Mood and Affect  Mood: Euthymic  Affect: Congruent   Thought Process  Thought Processes: Coherent  Descriptions of Associations:Intact  Orientation:Full (Time, Place and Person)  Thought Content:Logical  History of Schizophrenia/Schizoaffective disorder:No data recorded Duration of Psychotic Symptoms:No data recorded Hallucinations:Hallucinations: None  Ideas of Reference:None  Suicidal Thoughts:Suicidal Thoughts: No  Homicidal Thoughts:Homicidal Thoughts: No   Sensorium  Memory: Immediate Good  Judgment: Fair  Insight: Fair   Community education officer  Concentration: Good  Attention Span: Good  Recall: Good  Fund of Knowledge: Good  Language: Good  Psychomotor Activity  Psychomotor Activity: Psychomotor Activity: Normal  Assets  Assets: Communication Skills  Sleep  Sleep: Sleep: Good  Physical Exam: Physical Exam Constitutional:      Appearance: Normal appearance.  HENT:     Nose: No congestion.  Eyes:     Pupils: Pupils are equal, round, and reactive to light.  Pulmonary:     Effort: Pulmonary effort is normal.  Musculoskeletal:        General: Normal range of motion.  Neurological:     Mental Status: She is alert and oriented to person, place, and  time.    Review of Systems  Constitutional:  Negative for fever.  HENT: Negative.    Eyes: Negative.   Respiratory: Negative.    Cardiovascular: Negative.   Gastrointestinal: Negative.   Genitourinary: Negative.   Musculoskeletal: Negative.   Skin: Negative.   Neurological: Negative.   Psychiatric/Behavioral:  Positive for depression (Denies SI, denies HI, denies AVH, verbally contracts for safety outside of [facility]). Negative for hallucinations, memory loss, substance abuse and suicidal ideas. The patient is nervous/anxious (resolving) and has insomnia (resolving).    Blood pressure 108/68, pulse 85, temperature 97.9 F (36.6 C), temperature source Oral, resp. rate 16, height 5' 8"$  (1.727 m), weight 65.8 kg, SpO2 100 %, unknown if currently breastfeeding. Body mass index is 22.05 kg/m.   Social History   Tobacco Use  Smoking Status Every Day  Packs/day: 0.25   Types: Cigarettes   Last attempt to quit: 01/05/2017   Years since quitting: 5.4  Smokeless Tobacco Never   Tobacco Cessation:  A prescription for an FDA-approved tobacco cessation medication provided at discharge   Blood Alcohol level:  Lab Results  Component Value Date   ETH <10 123XX123    Metabolic Disorder Labs:  Lab Results  Component Value Date   HGBA1C 5.0 06/25/2022   MPG 96.8 06/25/2022   No results found for: "PROLACTIN" Lab Results  Component Value Date   CHOL 136 06/25/2022   TRIG 42 06/25/2022   HDL 66 06/25/2022   CHOLHDL 2.1 06/25/2022   VLDL 8 06/25/2022   LDLCALC 62 06/25/2022    See Psychiatric Specialty Exam and Suicide Risk Assessment completed by Attending Physician prior to discharge.  Discharge destination:  Home  Is patient on multiple antipsychotic therapies at discharge:  No   Has Patient had three or more failed trials of antipsychotic monotherapy by history:  No  Recommended Plan for Multiple Antipsychotic Therapies: NA  Discharge Instructions     Diet - low sodium  heart healthy   Complete by: As directed    Increase activity slowly   Complete by: As directed       Allergies as of 06/26/2022   No Known Allergies      Medication List     STOP taking these medications    acetaminophen 325 MG tablet Commonly known as: Tylenol   escitalopram 10 MG tablet Commonly known as: LEXAPRO       TAKE these medications      Indication  Blood Pressure Kit Monitor BP readings at home regularly O09.90 Large What changed: Another medication with the same name was removed. Continue taking this medication, and follow the directions you see here.  Indication: HTN   cholecalciferol 25 MCG (1000 UNIT) tablet Commonly known as: VITAMIN D3 Take 1 tablet (1,000 Units total) by mouth daily.  Indication: Vitamin D Deficiency   cyanocobalamin 1000 MCG/ML injection Commonly known as: VITAMIN B12 Inject 1 mL (1,000 mcg total) into the muscle every 30 (thirty) days. Start taking on: July 25, 2022  Indication: Inadequate Vitamin B12   nicotine 21 mg/24hr patch Commonly known as: NICODERM CQ - dosed in mg/24 hours Place 1 patch (21 mg total) onto the skin daily. Start taking on: June 27, 2022  Indication: Nicotine Addiction   polyvinyl alcohol 1.4 % ophthalmic solution Commonly known as: LIQUIFILM TEARS Place 1 drop into both eyes as needed for dry eyes.  Indication: DRY EYES   QUEtiapine 50 MG Tb24 24 hr tablet Commonly known as: SEROQUEL XR Take 2 tablets (100 mg total) by mouth at bedtime.  Indication: Manic-Depression        Follow-up [facility] [place]. Go on 06/27/2022.   Why: You have a therapy appt with this provider on Thursday, 06/27/2022 at [place] have a med management appt at this provider on 07/15/2022 at 10:30am.  Please call this provider if you need to reschedule. Contact information: [place] [address] [phone]         [facility]. Call.   Why: We  attempted to confirm appt dates with this provider, however they do not have a release of information to speak with [facility] and unable to provide dates of appointments.  Please follow up with this provider for ongoing family therapy and follow recommendations by  provider. Contact information: [address]  [place], [place] [address] ph: [phone] f: [phone]               Signed: Nicholes Rough, NP 06/26/2022, 1:34 PM

## 2022-06-26 NOTE — [facility] Suicide Risk Assessment (Cosign Needed Addendum)
Suicide Risk Assessment  Discharge Assessment    [facility] Discharge Suicide Risk Assessment   Principal Problem: Bipolar I disorder, current or most recent episode manic, with psychotic features [facility]) Discharge Diagnoses: Principal Problem:   Bipolar I disorder, current or most recent episode manic, with psychotic features ([place]) Active Problems:   Anxiety state   Insomnia   Tetrahydrocannabinol (THC) use disorder, mild, abuse   Nicotine dependence  Reason For Admission: Felicia Griffin is a 42 yo [place] American female with a prior mental health diagnosis of bipolar 1 d/o who was taken to the [facility] ER by EMS on 06/22/22 after her daughter called emergency services to report that "she wasn't acting right". When emergency services responded, pt was found to be confused & combative, requiring Versed 5 mg prior to transportation to the ER, and restraints and Geodon once in the ER d/t agitation. Pt was subsequently stabilized, medically cleared prior to being transported involuntarily to this [facility] for treatment and stabilization of her mood.                                      HOSPITAL COURSE During the patient's hospitalization, patient had extensive initial psychiatric evaluation, and follow-up psychiatric evaluations every day. Psychiatric diagnoses provided upon initial assessment are as listed above.  Patient's psychiatric medications were adjusted on admission as below: -Started Seroquel XR 100 mg nightly for mood stabilization -Started hydroxyzine 25 mg 3 times daily as needed for anxiety -Started Risperdal/Ativan/Geodon for agitation-please see the MAR for complete order -Started nicotine patch for nicotine dependence -Started nicotine gum for nicotine dependence as needed  During the hospitalization, other adjustments were made to the patient's psychiatric medication regimen. Medications at discharge are as follows:  -Continue Vitamin D 1000 units daily for Vit D deficiency -Continue  Vitamin B12 1000 units daily per neurology recommendations -Continue Seroquel XR 100 mg nightly for mood stabilization -Continue nicotine patch for nicotine dependence -Continue eye drops for dry eyes as needed  Patient's care was discussed during the interdisciplinary team meeting every day during the hospitalization. The patient denies having side effects to prescribed psychiatric medication. Gradually, patient started adjusting to milieu. The patient was evaluated each day by a clinical provider to ascertain response to treatment. Improvement was noted by the patient's report of decreasing symptoms, improved sleep and appetite, affect, medication tolerance, behavior, and participation in unit programming.  Patient was asked each day to complete a self inventory noting mood, mental status, pain, new symptoms, anxiety and concerns.    Symptoms were reported as significantly decreased or resolved completely by discharge. On day of discharge, the patient reports that their mood is stable. The patient denied having suicidal thoughts for more than 48 hours prior to discharge.  Patient denies having homicidal thoughts.  Patient denies having auditory hallucinations.  Patient denies any visual hallucinations or other symptoms of psychosis. The patient was motivated to continue taking medication with a goal of continued improvement in mental health.   The patient reports their target psychiatric symptoms of psychosis responded well to the psychiatric medications, and the patient reports overall benefit other psychiatric hospitalization. Supportive psychotherapy was provided to the patient. The patient also participated in regular group therapy while hospitalized. Coping skills, problem solving as well as relaxation therapies were also part of the unit programming.  Labs were reviewed with the patient, and abnormal results were discussed with the patient. Respiratory  panel negative, UDS positive for THC,  positive for benzos.  CBC with WBC slightly elevated at 11.9.  EtOH less than 10.  CMP with K of 3.3, 40 mEq was given in the ER.  Repeated BMP with K normal. vitamin B1 pending at time of discharge, pt educated to follow this up with her PCP. B12 WNL, but will supplement as per neurology recommendations. vitamin D low at 22.41, will supplement. lipid panel, hemoglobin A1c and TSH WNL. CT scan completed in the ER shows "vague hypodense region in the posterior aspect". MRI recommended by neurology, attending psychiatrist and writer spoke with pt about the importance of complying with the MRI while still hospitalized at this [facility]. Pt has refused, and has stated that she will follow this up outpatient with her primary care provider.  The patient is able to verbalize their individual safety plan to this provider.  # It is recommended to the patient to continue psychiatric medications as prescribed, after discharge from the hospital.    # It is recommended to the patient to follow up with your outpatient psychiatric provider and PCP.  # It was discussed with the patient, the impact of alcohol, drugs, tobacco have been there overall psychiatric and medical wellbeing, and total abstinence from substance use was recommended the patient.ed.  # Prescriptions provided or sent directly to preferred pharmacy at discharge. Patient agreeable to plan. Given opportunity to ask questions. Appears to feel comfortable with discharge.    # In the event of worsening symptoms, the patient is instructed to call the crisis hotline, 911 and or go to the nearest ED for appropriate evaluation and treatment of symptoms. To follow-up with primary care provider for other medical issues, concerns and or health care needs  # Patient was discharged home with a plan to follow up as noted below.   Total Time spent with patient: 45 minutes  Musculoskeletal: Strength & Muscle Tone: within normal limits Gait & Station: normal Patient  leans: N/A  Psychiatric Specialty Exam  Presentation  General Appearance:  Appropriate for Environment; Fairly Groomed  Eye Contact: Good  Speech: Clear and Coherent  Speech Volume: Normal  Handedness: Right   Mood and Affect  Mood: Euthymic  Duration of Depression Symptoms: No data recorded Affect: Congruent   Thought Process  Thought Processes: Coherent  Descriptions of Associations:Intact  Orientation:Full (Time, Place and Person)  Thought Content:Logical  History of Schizophrenia/Schizoaffective disorder:No data recorded Duration of Psychotic Symptoms:No data recorded Hallucinations:Hallucinations: None  Ideas of Reference:None  Suicidal Thoughts:Suicidal Thoughts: No  Homicidal Thoughts:Homicidal Thoughts: No   Sensorium  Memory: Immediate Good  Judgment: Fair  Insight: Fair  Community education officer  Concentration: Good  Attention Span: Good  Recall: Good  Fund of Knowledge: Good  Language: Good  Psychomotor Activity  Psychomotor Activity: Psychomotor Activity: Normal  Assets  Assets: Communication Skills   Sleep  Sleep: Sleep: Good   Physical Exam: Physical Exam Constitutional:      Appearance: Normal appearance.  HENT:     Head: Normocephalic.     Nose: Nose normal.  Eyes:     Pupils: Pupils are equal, round, and reactive to light.  Cardiovascular:     Rate and Rhythm: Normal rate.  Pulmonary:     Effort: Pulmonary effort is normal. No respiratory distress.  Musculoskeletal:     Cervical back: Normal range of motion.  Neurological:     Mental Status: She is alert and oriented to person, place, and time.  Sensory: No sensory deficit.     Coordination: Coordination normal.  Psychiatric:        Thought Content: Thought content normal.    Review of Systems  Constitutional:  Negative for fever.  HENT: Negative.    Eyes: Negative.   Respiratory: Negative.    Cardiovascular: Negative.  Negative for  chest pain.  Gastrointestinal: Negative.   Genitourinary: Negative.   Musculoskeletal: Negative.   Skin: Negative.   Neurological: Negative.  Negative for dizziness.  Psychiatric/Behavioral:  Positive for depression (Pt; denies SI, HI/AVH, denies paranoia, and verbally contracts for safety outside of this [facility]). Negative for hallucinations, memory loss, substance abuse and suicidal ideas. The patient is nervous/anxious. The patient does not have insomnia.    Blood pressure 108/68, pulse 85, temperature 97.9 F (36.6 C), temperature source Oral, resp. rate 16, height 5' 8"$  (1.727 m), weight 65.8 kg, SpO2 100 %, unknown if currently breastfeeding. Body mass index is 22.05 kg/m.  Mental Status Per Nursing Assessment::   On Admission:  NA  Demographic Factors:  Low socioeconomic status and Unemployed  Loss Factors: Financial problems/change in socioeconomic status  Historical Factors: NA  Risk Reduction Factors:   Responsible for children under 75 years of age and Living with another person, especially a relative  Continued Clinical Symptoms:  Previous Psychiatric Diagnoses and Treatments  Cognitive Features That Contribute To Risk:  None    Suicide Risk:  Mild:  There are no identifiable suicide plans, no associated intent, mild dysphoria and related symptoms, good self-control (both objective and subjective assessment), few other risk factors, and identifiable protective factors, including available and accessible social support.    Follow-up [facility]. Go on 06/27/2022.   Why: You have a therapy appt with this provider on Thursday, 06/27/2022 at [place] have a med management appt at this provider on 07/15/2022 at 10:30am.  Please call this provider if you need to reschedule. Contact information: [place] [address] [phone]         [facility]. Call.   Why: We attempted to confirm appt dates with  this provider, however they do not have a release of information to speak with [facility] and unable to provide dates of appointments.  Please follow up with this provider for ongoing family therapy and follow recommendations by provider. Contact information: [address]  [place], [place] [address] ph: [phone] f: [phone]               Nicholes Rough, NP 06/26/2022, 10:41 AM

## 2022-06-29 LAB — VITAMIN B1: Vitamin B1 (Thiamine): 93.9 nmol/L (ref 66.5–200.0)

## 2023-01-22 ENCOUNTER — Ambulatory Visit: Payer: Medicaid Other | Admitting: Obstetrics and Gynecology

## 2023-11-18 ENCOUNTER — Ambulatory Visit: Payer: MEDICAID | Admitting: Family Medicine
